# Patient Record
Sex: Female | Born: 1995
Health system: Southern US, Community
[De-identification: ages and names within clinical notes are randomized; demographics above are authoritative.]

## PROBLEM LIST (undated history)

## (undated) DIAGNOSIS — R011 Cardiac murmur, unspecified: Secondary | ICD-10-CM

## (undated) DIAGNOSIS — U071 COVID-19: Secondary | ICD-10-CM

## (undated) HISTORY — DX: COVID-19: U07.1

---

## 2019-05-24 ENCOUNTER — Emergency Department (HOSPITAL_COMMUNITY): Payer: Self-pay

## 2019-05-24 ENCOUNTER — Other Ambulatory Visit: Payer: Self-pay

## 2019-05-24 ENCOUNTER — Emergency Department (HOSPITAL_COMMUNITY)
Admission: EM | Admit: 2019-05-24 | Discharge: 2019-05-24 | Disposition: A | Discharge status: Home / Self Care | Payer: Self-pay | Attending: Emergency Medicine | Admitting: Emergency Medicine

## 2019-05-24 ENCOUNTER — Encounter (HOSPITAL_COMMUNITY): Payer: Self-pay | Admitting: Emergency Medicine

## 2019-05-24 DIAGNOSIS — N939 Abnormal uterine and vaginal bleeding, unspecified: Secondary | ICD-10-CM | POA: Insufficient documentation

## 2019-05-24 DIAGNOSIS — R0789 Other chest pain: Secondary | ICD-10-CM | POA: Insufficient documentation

## 2019-05-24 DIAGNOSIS — R531 Weakness: Secondary | ICD-10-CM | POA: Insufficient documentation

## 2019-05-24 HISTORY — DX: Cardiac murmur, unspecified: R01.1

## 2019-05-24 LAB — BASIC METABOLIC PANEL
Anion gap: 8 (ref 5–15)
BUN: 10 mg/dL (ref 6–20)
CO2: 24 mmol/L (ref 22–32)
Calcium: 9.2 mg/dL (ref 8.9–10.3)
Chloride: 106 mmol/L (ref 98–111)
Creatinine, Ser: 0.66 mg/dL (ref 0.44–1.00)
GFR calc Af Amer: >60 mL/min (ref >60)
GFR calc non Af Amer: >60 mL/min (ref >60)
Glucose, Bld: 114 mg/dL — ABNORMAL HIGH (ref 70–99)
Potassium: 4 mmol/L (ref 3.5–5.1)
Sodium: 138 mmol/L (ref 135–145)

## 2019-05-24 LAB — CBC
HCT: 42.2 % (ref 36.0–46.0)
Hemoglobin: 14 g/dL (ref 12.0–15.0)
MCH: 28.9 pg (ref 26.0–34.0)
MCHC: 33.2 g/dL (ref 30.0–36.0)
MCV: 87 fL (ref 80.0–100.0)
Platelets: 395 K/uL (ref 150–400)
RBC: 4.85 MIL/uL (ref 3.87–5.11)
RDW: 12.8 % (ref 11.5–15.5)
WBC: 13.6 K/uL — ABNORMAL HIGH (ref 4.0–10.5)
nRBC: 0 % (ref 0.0–0.2)

## 2019-05-24 LAB — D-DIMER, QUANTITATIVE: D-Dimer, Quant: 0.34 ug/mL-FEU (ref 0.00–0.50)

## 2019-05-24 LAB — TROPONIN I (HIGH SENSITIVITY)
Troponin I (High Sensitivity): <2 ng/L (ref <18)
Troponin I (High Sensitivity): <2 ng/L (ref <18)

## 2019-05-24 MED ORDER — NAPROXEN 500 MG PO TABS: 500.0000 mg | ORAL_TABLET | Freq: Two times a day (BID) | ORAL | 0 refills | Status: DC | PRN

## 2019-05-24 NOTE — ED Provider Notes (Signed)
[facility] Provider Note   CSN: [id number] Arrival date & time: 05/24/19  0018     History Chief Complaint  Patient presents with  . Chest Pain    weakness    Christine Freeman is a 24 y.o. female.  Patient here with chest tightness and shortness of breath that onset when she went to sleep around 10:30 PM.  States she did not actually go to sleep but felt tightness in her chest when she went to lie down.  It feels like a pressure.  It is sometimes in the center of her chest, times on the right sometimes on the left.  It comes and goes lasting for about 5 to 10 seconds at a time.  Patient actually had this pain on and off for the past several days since she started her menstrual cycle but became more severe tonight.  Also has some associated shortness of breath and pain with breathing that is new tonight.  There is no cough or fever.  Denies any possibility of pregnancy.  Denies any alcohol or drug use.  No birth control use.  She is not had any chest pain currently.  Is somewhat worse with palpation.  She denies any abdominal pain, nausea, vomiting, pain with urination or blood in the urine.  She is also having some occasional tingling in her arms and legs but no weakness.  Chest pain does not radiate anywhere.  The history is provided by the patient and a significant other.  Chest Pain Associated symptoms: no abdominal pain, no dizziness, no fever, no headache, no nausea, no vomiting and no weakness        Past Medical History:  Diagnosis Date  . Heart murmur     There are no problems to display for this patient.   History reviewed. No pertinent surgical history.   OB History   No obstetric history on file.     History reviewed. No pertinent family history.  Social History   Tobacco Use  . Smoking status: Never Smoker  . Smokeless tobacco: Never Used  Substance Use Topics  . Alcohol use: Not Currently  . Drug use: Never    Home  Medications Prior to Admission medications   Not on File    Allergies    Other  Review of Systems   Review of Systems  Constitutional: Negative for activity change, appetite change and fever.  HENT: Negative for congestion and rhinorrhea.   Respiratory: Positive for chest tightness.   Cardiovascular: Positive for chest pain.  Gastrointestinal: Negative for abdominal pain, nausea and vomiting.  Genitourinary: Positive for vaginal bleeding. Negative for dysuria and hematuria.  Musculoskeletal: Positive for arthralgias and myalgias.  Skin: Negative for rash.  Neurological: Negative for dizziness, weakness and headaches.   all other systems are negative except as noted in the HPI and PMH.    Physical Exam Updated Vital Signs BP 123/71 (BP Location: Right Arm)   Pulse 100   Temp 98.5 F (36.9 C) (Oral)   Resp 18   Ht 4\' 11"  (1.499 m)   Wt 72.6 kg   LMP 05/23/2019   SpO2 98%   BMI 32.32 kg/m   Physical Exam Vitals and nursing note reviewed.  Constitutional:      General: She is not in acute distress.    Appearance: She is well-developed.     Comments: Mildly anxious  HENT:     Head: Normocephalic and atraumatic.     Mouth/Throat:  Pharynx: No oropharyngeal exudate.  Eyes:     Conjunctiva/sclera: Conjunctivae normal.     Pupils: Pupils are equal, round, and reactive to light.  Neck:     Comments: No meningismus. Cardiovascular:     Rate and Rhythm: Normal rate and regular rhythm.     Heart sounds: Normal heart sounds. No murmur.  Pulmonary:     Effort: Pulmonary effort is normal. No respiratory distress.     Breath sounds: Normal breath sounds.     Comments: Central chest tenderness, worse with palpation Chest:     Chest wall: Tenderness present.  Abdominal:     Palpations: Abdomen is soft.     Tenderness: There is no abdominal tenderness. There is no guarding or rebound.  Musculoskeletal:        General: No tenderness. Normal range of motion.     Cervical  back: Normal range of motion and neck supple.  Skin:    General: Skin is warm.  Neurological:     Mental Status: She is alert and oriented to person, place, and time.     Cranial Nerves: No cranial nerve deficit.     Motor: No abnormal muscle tone.     Coordination: Coordination normal.     Comments:  5/5 strength throughout. CN 2-12 intact.Equal grip strength.   Psychiatric:        Behavior: Behavior normal.     ED Results / Procedures / Treatments   Labs (all labs ordered are listed, but only abnormal results are displayed) Labs Reviewed  CBC - Abnormal; Notable for the following components:      Result Value   WBC 13.6 (*)    All other components within normal limits  BASIC METABOLIC PANEL - Abnormal; Notable for the following components:   Glucose, Bld 114 (*)    All other components within normal limits  D-DIMER, QUANTITATIVE (NOT AT [facility])  I-STAT BETA HCG BLOOD, ED ([facility], [facility], [facility] ONLY)  TROPONIN I (HIGH SENSITIVITY)  TROPONIN I (HIGH SENSITIVITY)    EKG EKG Interpretation  Date/Time:  Saturday May 24 2019 00:49:40 EDT Ventricular Rate:  88 PR Interval:  130 QRS Duration: 76 QT Interval:  356 QTC Calculation: 430 R Axis:   85 Text Interpretation: Sinus rhythm with marked sinus arrhythmia Nonspecific T wave abnormality Abnormal ECG No previous ECGs available Confirmed by Glynn Octave [phone]) on 05/24/2019 12:55:47 AM   Radiology DG Chest 2 View  Result Date: 05/24/2019 CLINICAL DATA:  Chest pain and weakness for 2 hours EXAM: CHEST - 2 VIEW COMPARISON:  None. FINDINGS: Frontal and lateral views of the chest demonstrate an unremarkable cardiac silhouette. No airspace disease, effusion, or pneumothorax. There are no acute bony abnormalities. IMPRESSION: 1. No acute intrathoracic process. Electronically Signed   By: Sharlet Salina M.D.   On: 05/24/2019 01:45    Procedures Procedures (including critical care time)  Medications Ordered in ED Medications - No  data to display  ED Course  I have reviewed the triage vital signs and the nursing notes.  Pertinent labs & imaging results that were available during my care of the patient were reviewed by me and considered in my medical decision making (see chart for details).    MDM Rules/Calculators/A&P                      Intermittent chest tightness and shortness of breath since about 10:30 PM.  EKG is sinus rhythm with nonspecific T wave abnormality, no comparison.  Vitals are stable.  No hypoxia.  Troponin negative x2.  D-dimer negative.  Chest x-ray negative.  Electrolytes otherwise reassuring.  Low suspicion for ACS or PE.  Pain atypical for ACS.  May have anxiety component as well.  Will treat with anti-inflammatories for suspected musculoskeletal chest pain.  Follow-up with your PCP.  Return to the ED if chest pain becomes exertional, associated shortness of breath, nausea, vomiting, diaphoresis, or other concerns. Final Clinical Impression(s) / ED Diagnoses Final diagnoses:  Atypical chest pain    Rx / DC Orders ED Discharge Orders    None       Haylynn Pha, Jeannett Senior, MD 05/24/19 0745

## 2019-05-24 NOTE — Discharge Instructions (Signed)
There is no evidence of heart attack or blood clot in the lung.  Take the anti-inflammatory as prescribed.  Follow-up with your doctor.  Return to the ED if chest pain gets exertional, associated shortness of breath, nausea, vomiting, diaphoresis, other concerns.

## 2019-05-24 NOTE — ED Triage Notes (Signed)
Patient states chest pain and weakness that started 2 hours ago. Patient states that she is hurting all over.

## 2019-07-15 ENCOUNTER — Emergency Department (HOSPITAL_COMMUNITY)
Admission: EM | Admit: 2019-07-15 | Discharge: 2019-07-15 | Disposition: A | Discharge status: Home / Self Care | Payer: Self-pay | Attending: Emergency Medicine | Admitting: Emergency Medicine

## 2019-07-15 ENCOUNTER — Emergency Department (HOSPITAL_COMMUNITY): Payer: Self-pay

## 2019-07-15 ENCOUNTER — Encounter (HOSPITAL_COMMUNITY): Payer: Self-pay | Admitting: Emergency Medicine

## 2019-07-15 ENCOUNTER — Other Ambulatory Visit: Payer: Self-pay

## 2019-07-15 DIAGNOSIS — R0789 Other chest pain: Secondary | ICD-10-CM | POA: Insufficient documentation

## 2019-07-15 LAB — BASIC METABOLIC PANEL
Anion gap: 8 (ref 5–15)
BUN: 9 mg/dL (ref 6–20)
CO2: 23 mmol/L (ref 22–32)
Calcium: 9.1 mg/dL (ref 8.9–10.3)
Chloride: 106 mmol/L (ref 98–111)
Creatinine, Ser: 0.6 mg/dL (ref 0.44–1.00)
GFR calc Af Amer: >60 mL/min (ref >60)
GFR calc non Af Amer: >60 mL/min (ref >60)
Glucose, Bld: 90 mg/dL (ref 70–99)
Potassium: 3.9 mmol/L (ref 3.5–5.1)
Sodium: 137 mmol/L (ref 135–145)

## 2019-07-15 LAB — CBC WITH DIFFERENTIAL/PLATELET
Abs Immature Granulocytes: 0.06 10*3/uL (ref 0.00–0.07)
Basophils Absolute: 0.1 10*3/uL (ref 0.0–0.1)
Basophils Relative: 0 %
Eosinophils Absolute: 0.4 10*3/uL (ref 0.0–0.5)
Eosinophils Relative: 3 %
HCT: 41 % (ref 36.0–46.0)
Hemoglobin: 13.7 g/dL (ref 12.0–15.0)
Immature Granulocytes: 0 %
Lymphocytes Relative: 30 %
Lymphs Abs: 4.3 10*3/uL — ABNORMAL HIGH (ref 0.7–4.0)
MCH: 28.3 pg (ref 26.0–34.0)
MCHC: 33.4 g/dL (ref 30.0–36.0)
MCV: 84.7 fL (ref 80.0–100.0)
Monocytes Absolute: 0.6 10*3/uL (ref 0.1–1.0)
Monocytes Relative: 4 %
Neutro Abs: 9.2 10*3/uL — ABNORMAL HIGH (ref 1.7–7.7)
Neutrophils Relative %: 63 %
Platelets: 399 10*3/uL (ref 150–400)
RBC: 4.84 MIL/uL (ref 3.87–5.11)
RDW: 12.8 % (ref 11.5–15.5)
WBC: 14.7 10*3/uL — ABNORMAL HIGH (ref 4.0–10.5)
nRBC: 0 % (ref 0.0–0.2)

## 2019-07-15 LAB — URINALYSIS, ROUTINE W REFLEX MICROSCOPIC
Bilirubin Urine: NEGATIVE
Glucose, UA: NEGATIVE mg/dL
Hgb urine dipstick: NEGATIVE
Ketones, ur: NEGATIVE mg/dL
Leukocytes,Ua: NEGATIVE
Nitrite: NEGATIVE
Protein, ur: NEGATIVE mg/dL
Specific Gravity, Urine: 1.002 — ABNORMAL LOW (ref 1.005–1.030)
pH: 7 (ref 5.0–8.0)

## 2019-07-15 LAB — PREGNANCY, URINE: Preg Test, Ur: NEGATIVE

## 2019-07-15 LAB — TROPONIN I (HIGH SENSITIVITY)
Troponin I (High Sensitivity): <2 ng/L (ref <18)
Troponin I (High Sensitivity): <2 ng/L (ref <18)

## 2019-07-15 MED ORDER — DOXYCYCLINE HYCLATE 100 MG PO CAPS: 100.0000 mg | ORAL_CAPSULE | Freq: Two times a day (BID) | ORAL | 0 refills | Status: DC

## 2019-07-15 NOTE — Discharge Instructions (Addendum)
Take the antibiotic as directed until its finished.  You may continue taking over-the-counter naproxen (Aleve) 2 tablets will total 440 mg.  You may take 2 twice a day with food for pain or you may take ibuprofen 600 mg 3 times a day with food, but do not take both.  Be sure to keep your appointment with your primary care provider this week.  Return to the ER for any worsening symptoms.

## 2019-07-15 NOTE — ED Provider Notes (Signed)
[name] EMERGENCY DEPARTMENT Provider Note   CSN: [id number] Arrival date & time: 07/15/19  0935     History Chief Complaint  Patient presents with  . Chest Pain    Christine Freeman is a 24 y.o. female.  HPI      Christine Freeman is a 24 y.o. female who presents to the Emergency Department complaining of intermittent chest pain for two months.  She describes an aching pain to the center of her chest that is non-radiating.  Her pain is usually associated with her menstrual cycle, but she is now having pain and not menstruating.  She has been evaluated for her symptoms previously, but symptoms continue.  She denies fever, cough, shortness of breath.   Past Medical History:  Diagnosis Date  . Heart murmur     There are no problems to display for this patient.   History reviewed. No pertinent surgical history.   OB History   No obstetric history on file.     No family history on file.  Social History   Tobacco Use  . Smoking status: Never Smoker  . Smokeless tobacco: Never Used  Substance Use Topics  . Alcohol use: Yes    Comment: socially   . Drug use: Never    Home Medications Prior to Admission medications   Medication Sig Start Date End Date Taking? Authorizing Provider  naproxen (NAPROSYN) 500 MG tablet Take 1 tablet (500 mg total) by mouth 2 (two) times daily as needed. 05/24/19   Glynn Octave, MD    Allergies    Other  Review of Systems   Review of Systems  Constitutional: Negative for chills and fever.  Respiratory: Negative for cough and shortness of breath.   Cardiovascular: Positive for chest pain.  Gastrointestinal: Negative for abdominal pain, nausea and vomiting.  Genitourinary: Negative for dysuria and flank pain.  Musculoskeletal: Negative for back pain and neck pain.  Skin: Negative for color change and rash.  Neurological: Negative for dizziness, weakness, numbness and headaches.    Physical Exam Updated Vital Signs BP 107/65    Pulse 86   Temp 98.3 F (36.8 C) (Oral)   Resp 15   Ht 4\' 11"  (1.499 m)   Wt 72.6 kg   LMP 06/30/2019   SpO2 100%   BMI 32.32 kg/m   Physical Exam Vitals and nursing note reviewed.  Constitutional:      General: She is not in acute distress.    Appearance: Normal appearance. She is well-developed. She is not ill-appearing or toxic-appearing.  HENT:     Mouth/Throat:     Mouth: Mucous membranes are moist.  Cardiovascular:     Rate and Rhythm: Normal rate and regular rhythm.     Pulses: Normal pulses.  Pulmonary:     Effort: Pulmonary effort is normal. No respiratory distress.     Breath sounds: Normal breath sounds. No wheezing.  Chest:     Chest wall: No tenderness.  Abdominal:     Palpations: Abdomen is soft.     Tenderness: There is no abdominal tenderness.  Musculoskeletal:        General: Normal range of motion.     Cervical back: Normal range of motion.  Skin:    General: Skin is warm.     Capillary Refill: Capillary refill takes less than 2 seconds.     Findings: No rash.  Neurological:     General: No focal deficit present.     Mental Status: She  is alert.     ED Results / Procedures / Treatments   Labs (all labs ordered are listed, but only abnormal results are displayed) Labs Reviewed  CBC WITH DIFFERENTIAL/PLATELET - Abnormal; Notable for the following components:      Result Value   WBC 14.7 (*)    Neutro Abs 9.2 (*)    Lymphs Abs 4.3 (*)    All other components within normal limits  URINALYSIS, ROUTINE W REFLEX MICROSCOPIC - Abnormal; Notable for the following components:   Color, Urine COLORLESS (*)    Specific Gravity, Urine 1.002 (*)    All other components within normal limits  BASIC METABOLIC PANEL  PREGNANCY, URINE  TROPONIN I (HIGH SENSITIVITY)  TROPONIN I (HIGH SENSITIVITY)    EKG EKG Interpretation  Date/Time:  Tuesday July 15 2019 11:15:26 EDT Ventricular Rate:  90 PR Interval:    QRS Duration: 81 QT Interval:  344 QTC  Calculation: 421 R Axis:   83 Text Interpretation: Sinus rhythm Low voltage, precordial leads No significant change since last tracing Confirmed by Dorie Rank [phone]) on 07/15/2019 12:06:24 PM   Radiology DG Chest 1 View  Result Date: 07/15/2019 CLINICAL DATA:  Left-sided chest pain EXAM: CHEST  1 VIEW COMPARISON:  05/24/2019 FINDINGS: The heart size and mediastinal contours are within normal limits. Mildly prominent bibasilar interstitial markings. No pleural effusion or pneumothorax. The visualized skeletal structures are unremarkable. IMPRESSION: Mildly prominent bibasilar interstitial markings which may reflect atelectasis versus developing infiltrates in the appropriate clinical setting. Electronically Signed   By: Davina Poke D.O.   On: 07/15/2019 13:57    Procedures Procedures (including critical care time)  Medications Ordered in ED Medications - No data to display  ED Course  I have reviewed the triage vital signs and the nursing notes.  Pertinent labs & imaging results that were available during my care of the patient were reviewed by me and considered in my medical decision making (see chart for details).    MDM Rules/Calculators/A&P                      Pt with intermittent chest pain for 2 months.  CXR shows possible developing infiltrates. Mildly leukocytosis.  Afebrile.  PERC negative. Doubt ACS.   Will treat with abx.  Pt appears appropriate for d/c home.  Agrees to close out patient f/u.  Return precautions discussed.     Final Clinical Impression(s) / ED Diagnoses Final diagnoses:  Atypical chest pain    Rx / DC Orders ED Discharge Orders    None       Bufford Lope 07/18/19 1121    Dorie Rank, MD 07/19/19 0700

## 2019-07-15 NOTE — ED Triage Notes (Signed)
Pt c/o intermittent chest pain since April. States she has been evaluated before and was told her chest wall was irritated. Pt states the chest pain usually comes around her menstrual cycle, but this time she is not on her period. Pt tearful.

## 2019-07-18 ENCOUNTER — Other Ambulatory Visit: Payer: Self-pay

## 2019-07-18 ENCOUNTER — Ambulatory Visit (INDEPENDENT_AMBULATORY_CARE_PROVIDER_SITE_OTHER): Payer: Self-pay | Admitting: Family Medicine

## 2019-07-18 ENCOUNTER — Encounter: Payer: Self-pay | Admitting: Family Medicine

## 2019-07-18 VITALS — BP 106/68 | HR 86 | Temp 97.8°F | Ht 59.0 in | Wt 167.0 lb

## 2019-07-18 DIAGNOSIS — Z8679 Personal history of other diseases of the circulatory system: Secondary | ICD-10-CM

## 2019-07-18 DIAGNOSIS — Z09 Encounter for follow-up examination after completed treatment for conditions other than malignant neoplasm: Secondary | ICD-10-CM

## 2019-07-18 DIAGNOSIS — Z836 Family history of other diseases of the respiratory system: Secondary | ICD-10-CM

## 2019-07-18 DIAGNOSIS — E6609 Other obesity due to excess calories: Secondary | ICD-10-CM

## 2019-07-18 DIAGNOSIS — Z6833 Body mass index (BMI) 33.0-33.9, adult: Secondary | ICD-10-CM | POA: Insufficient documentation

## 2019-07-18 DIAGNOSIS — Z833 Family history of diabetes mellitus: Secondary | ICD-10-CM

## 2019-07-18 DIAGNOSIS — R002 Palpitations: Secondary | ICD-10-CM

## 2019-07-18 DIAGNOSIS — R0789 Other chest pain: Secondary | ICD-10-CM | POA: Insufficient documentation

## 2019-07-18 HISTORY — DX: Personal history of other diseases of the circulatory system: Z86.79

## 2019-07-18 HISTORY — DX: Family history of diabetes mellitus: Z83.3

## 2019-07-18 HISTORY — DX: Family history of other diseases of the respiratory system: Z83.6

## 2019-07-18 HISTORY — DX: Palpitations: R00.2

## 2019-07-18 NOTE — Patient Instructions (Signed)
    I appreciate the opportunity to provide you with care for your health and wellness. Today we discussed: established care  Follow up: 6 months   No labs or referrals today  Great to meet you both today! Have a good Summer; call if you need anything.  Please continue to practice social distancing to keep you, your family, and our community safe.  If you must go out, please wear a mask and practice good handwashing.  It was a pleasure to see you and I look forward to continuing to work together on your health and well-being. Please do not hesitate to call the office if you need care or have questions about your care.  Have a wonderful day and week. With Gratitude, Tereasa Coop, DNP, AGNP-BC    Palpitations Palpitations are feelings that your heartbeat is not normal. Your heartbeat may feel like it is:  Uneven.  Faster than normal.  Fluttering.  Skipping a beat. This is usually not a serious problem. In some cases, you may need tests to rule out any serious problems. Follow these instructions at home: Pay attention to any changes in your condition. Take these actions to help manage your symptoms: Eating and drinking  Avoid: ? Coffee, tea, soft drinks, and energy drinks. ? Chocolate. ? Alcohol. ? Diet pills. Lifestyle   Try to lower your stress. These things can help you relax: ? Yoga. ? Deep breathing and meditation. ? Exercise. ? Using words and images to create positive thoughts (guided imagery). ? Using your mind to control things in your body (biofeedback).  Do not use drugs.  Get plenty of rest and sleep. Keep a regular bed time. General instructions   Take over-the-counter and prescription medicines only as told by your doctor.  Do not use any products that contain nicotine or tobacco, such as cigarettes and e-cigarettes. If you need help quitting, ask your doctor.  Keep all follow-up visits as told by your doctor. This is important. You may need  more tests if palpitations do not go away or get worse. Contact a doctor if:  Your symptoms last more than 24 hours.  Your symptoms occur more often. Get help right away if you:  Have chest pain.  Feel short of breath.  Have a very bad headache.  Feel dizzy.  Pass out (faint). Summary  Palpitations are feelings that your heartbeat is uneven or faster than normal. It may feel like your heart is fluttering or skipping a beat.  Avoid food and drinks that may cause palpitations. These include caffeine, chocolate, and alcohol.  Try to lower your stress. Do not smoke or use drugs.  Get help right away if you faint or have chest pain, shortness of breath, a severe headache, or dizziness. This information is not intended to replace advice given to you by your health care provider. Make sure you discuss any questions you have with your health care provider. Document Revised: 03/07/2017 Document Reviewed: 03/07/2017 Elsevier Patient Education  2020 [employer].

## 2019-07-18 NOTE — Progress Notes (Signed)
Subjective:  Patient ID: Christine Freeman, female    DOB: 1995-10-08  Age: 24 y.o. MRN: [id number]  CC:  Chief Complaint  Patient presents with  . New Patient (Initial Visit)    new pt no former pcp has been seen twice in er at [facility] since april for chest pain heart palpatations and irregular heart beat has been better since the last visit at the er       HPI  HPI   Christine Freeman is a 24 year old female patient who presents today to establish care.  She presents with her boyfriend who is in the room.  And she is willing to have him in the emergency room discussion today.  Most recently was in the emergency room complaining of intermittent chest pain for the last 2 months.  She was described as an achy pain in the center of her chest that was nonradiating.  Pain was associated usually with her menstrual cycle but now she is having pain she was not menstruating with prompted her to go to the emergency room back on June 8.  She had been evaluated previously for similar symptoms but the symptoms continued.  She denied having any fevers, chills, shortness of breath or cough at that time.  Intermittent chest pain for 2 months.  Chest x-ray showed possible infiltrates mildly leukocytosis.  She was afebrile.  PERC negative.  Doubt it was ACS.  Treated with antibiotics.  Was appropriate to discharge home reports that she is taking her antibiotics as directed.  Does note that she had some improvement with some of her discomfort.  She reports that she has a family history of lung disease in several family members.  None of them smoked.  Family history of diabetics.  And she herself has had a heart murmur. She has a family tree for her GYN care Pap smears.  She has good cycles that are regular cramps have gotten worse but overall she is doing okay with them.  She does not currently have insurance at this time so she is unable to see a dentist.  She does wear glasses overall glasses are doing well.   Has not seen the eye doctor little bit.  Palpitations are not associated with psych or caffeine.  Use about 3 seconds will take her breath away or make her go to the floor very frightening for her.  She reports that she has a cousin who stated that he had A. Fib.  She has gone on hikes and done physical activity without any problem or palpitation irregularity.  She is concerned about exercising and wanted to know what she can do what she can eat because she is afraid that whenever she does her eat could trigger the heart palpitations and make her feel bad.  Today patient denies signs and symptoms of COVID 19 infection including fever, chills, cough, shortness of breath, and headache. Past Medical, Surgical, Social History, Allergies, and Medications have been Reviewed.   Past Medical History:  Diagnosis Date  . Heart murmur     Current Meds  Medication Sig  . acetaminophen (TYLENOL) 325 MG tablet Take 650 mg by mouth every 6 (six) hours as needed.  . doxycycline (VIBRAMYCIN) 100 MG capsule Take 1 capsule (100 mg total) by mouth 2 (two) times daily.  Marland Kitchen ibuprofen (ADVIL) 200 MG tablet Take 200 mg by mouth every 6 (six) hours as needed.  . naproxen (NAPROSYN) 500 MG tablet Take 1 tablet (500  mg total) by mouth 2 (two) times daily as needed.    ROS:  Review of Systems  Constitutional: Negative.   HENT: Negative.   Eyes: Negative.   Respiratory: Negative.   Cardiovascular: Positive for chest pain and palpitations.  Gastrointestinal: Negative.   Genitourinary: Negative.   Musculoskeletal: Negative.   Skin: Negative.   Neurological: Negative.   Endo/Heme/Allergies: Negative.   Psychiatric/Behavioral: The patient is nervous/anxious.   All other systems reviewed and are negative.    Objective:   Today's Vitals: BP 106/68 (BP Location: Right Arm, Patient Position: Sitting, Cuff Size: Normal)   Pulse 86   Temp 97.8 F (36.6 C) (Temporal)   Ht 4\' 11"  (1.499 m)   Wt 167 lb (75.8  kg)   LMP 06/30/2019   SpO2 99%   BMI 33.73 kg/m  Vitals with BMI 07/18/2019 07/15/2019 07/15/2019  Height 4\' 11"  - -  Weight 167 lbs - -  BMI 33.71 - -  Systolic 106 112 -  Diastolic 68 76 -  Pulse 86 - 91     Physical Exam Vitals and nursing note reviewed.  Constitutional:      Appearance: Normal appearance. She is well-developed and well-groomed. She is obese.  HENT:     Head: Normocephalic and atraumatic.     Right Ear: External ear normal.     Left Ear: External ear normal.     Mouth/Throat:     Comments: Mask in place  Eyes:     General:        Right eye: No discharge.        Left eye: No discharge.     Conjunctiva/sclera: Conjunctivae normal.  Cardiovascular:     Rate and Rhythm: Normal rate and regular rhythm.     Pulses: Normal pulses.     Heart sounds: Normal heart sounds.  Pulmonary:     Effort: Pulmonary effort is normal.     Breath sounds: Normal breath sounds.  Musculoskeletal:        General: Normal range of motion.     Cervical back: Normal range of motion and neck supple.  Skin:    General: Skin is warm.  Neurological:     General: No focal deficit present.     Mental Status: She is alert and oriented to person, place, and time.  Psychiatric:        Attention and Perception: Attention normal.        Mood and Affect: Mood normal.        Speech: Speech normal.        Behavior: Behavior normal. Behavior is cooperative.        Thought Content: Thought content normal.        Cognition and Memory: Cognition normal.        Judgment: Judgment normal.    Depression screen PHQ 2/9 07/18/2019  Decreased Interest 0  Down, Depressed, Hopeless 0  PHQ - 2 Score 0  Altered sleeping 0  Tired, decreased energy 2  Change in appetite 0  Feeling bad or failure about yourself  0  Trouble concentrating 0  Moving slowly or fidgety/restless 0  Suicidal thoughts 0  PHQ-9 Score 2  Difficult doing work/chores Not difficult at all      Assessment   1.  Palpitations with regular cardiac rhythm   2. Atypical chest pain   3. Class 1 obesity due to excess calories without serious comorbidity with body mass index (BMI) of 33.0 to 33.9 in adult  4. History of heart murmur in childhood   5. Family history of lung disease   6. Family history of diabetes mellitus     Tests ordered No orders of the defined types were placed in this encounter.    Plan: Please see assessment and plan per problem list above.   No orders of the defined types were placed in this encounter.   Patient to follow-up in 6 months   Perlie Mayo, NP

## 2019-07-24 DIAGNOSIS — Z09 Encounter for follow-up examination after completed treatment for conditions other than malignant neoplasm: Secondary | ICD-10-CM | POA: Insufficient documentation

## 2019-07-24 NOTE — Assessment & Plan Note (Signed)
  educated about the importance of exercise daily to help with weight management. A minumum of 30 minutes daily is recommended. Additionally, importance of healthy food choices  with portion control discussed. Wt Readings from Last 3 Encounters:  07/18/19 167 lb (75.8 kg)  07/15/19 160 lb (72.6 kg)  05/24/19 160 lb (72.6 kg)

## 2019-07-24 NOTE — Assessment & Plan Note (Signed)
Not having any chest pain today in office.  Overall she is had good EKGs.  Unsure of true cardiac etiology.  She had palpitations was seen to have a change on her x-ray at her last visit to the emergency room and was started on medication.  She reports taking her medication as directed.  Is already starting to feel better.

## 2019-07-24 NOTE — Assessment & Plan Note (Signed)
Reviewed most hospital notes over last 2 months.  Extensively with labs, findings and testing.  I agree with everything that has been documented and diagnosed.  Encouraged her to continue her antibiotic at this time.  If she reports that she continues to have palpitations we will get a cardiology referral for possible Holter monitor assessment. Patient acknowledged agreement and understanding of the plan.

## 2019-07-24 NOTE — Assessment & Plan Note (Signed)
I do not see any or hear any murmur on exam today.

## 2019-07-24 NOTE — Assessment & Plan Note (Signed)
Reports palpitations unable to catch these on EKGs when she was in the emergency room.  If she continues to have the issue or concern we will look at doing a cardiology referral.  I have educated her on the signs and symptoms and causes of the palpitations and that she should exercise and not refrain from doing so.  She should also refrain from doing extra caffeine as she was drinking a lot of caffeine when these first started.

## 2019-07-28 ENCOUNTER — Telehealth: Payer: Self-pay

## 2019-07-28 NOTE — Telephone Encounter (Signed)
Spoke with patient. She states she would like a refill on the doxycycline she was taking. Her palps have come back since she finished it. She saw Dahlia Client on 6/11 for this issue. I advised her we usually dont refill an antibiotic without a visit. Appt scheduled for tomorrow at 3pm with [name]

## 2019-07-28 NOTE — Telephone Encounter (Signed)
Pt is calling advising that she has random heart palpations\Chest Pains.  Wants to see if the Dr can prescribe her something.   Sending back to Abby---

## 2019-07-29 ENCOUNTER — Other Ambulatory Visit: Payer: Self-pay

## 2019-07-29 ENCOUNTER — Ambulatory Visit (INDEPENDENT_AMBULATORY_CARE_PROVIDER_SITE_OTHER): Payer: Self-pay | Admitting: Family Medicine

## 2019-07-29 ENCOUNTER — Encounter: Payer: Self-pay | Admitting: Family Medicine

## 2019-07-29 VITALS — BP 111/74 | HR 86 | Temp 97.5°F | Ht 59.0 in | Wt 165.0 lb

## 2019-07-29 DIAGNOSIS — R002 Palpitations: Secondary | ICD-10-CM

## 2019-07-29 DIAGNOSIS — R0789 Other chest pain: Secondary | ICD-10-CM

## 2019-07-29 NOTE — Progress Notes (Signed)
Subjective:  Patient ID: Christine Freeman, female    DOB: 10/29/1995  Age: 24 y.o. MRN: [id number]  CC:  Chief Complaint  Patient presents with  . Chest Pain    no palpatations started saturday the first day she didnt have naproxen and doxicycline since she ran out she has had the chest pain has had it every day since then very random comes and goes       HPI  HPI  Christine Freeman is a 24 year old female who presents today after reporting that she thinks that she still has ongoing chest discomfort that returned after finishing her naproxen and doxycycline.  She reports that she has had the discomfort since stopping the medications however she has not had any discomfort today.  She reports that she has been taking 600 mg of ibuprofen as needed for chest discomfort.  She has not focused on any exercises or stretches or posture changes but has been trying to make sure she drinks decaf and working on what she eats to avoid heartburn-like symptoms. She denies having any chest pain today in the office.  Denies having any palpitations today in the office.  Today patient denies signs and symptoms of COVID 19 infection including fever, chills, cough, shortness of breath, and headache. Past Medical, Surgical, Social History, Allergies, and Medications have been Reviewed.   Past Medical History:  Diagnosis Date  . Heart murmur     Current Meds  Medication Sig  . ibuprofen (ADVIL) 200 MG tablet Take 200 mg by mouth every 6 (six) hours as needed.  . [DISCONTINUED] acetaminophen (TYLENOL) 325 MG tablet Take 650 mg by mouth every 6 (six) hours as needed.  . [DISCONTINUED] doxycycline (VIBRAMYCIN) 100 MG capsule Take 1 capsule (100 mg total) by mouth 2 (two) times daily.  . [DISCONTINUED] naproxen (NAPROSYN) 500 MG tablet Take 1 tablet (500 mg total) by mouth 2 (two) times daily as needed.    ROS:  Review of Systems  Constitutional: Negative.   HENT: Negative.   Eyes: Negative.   Respiratory:  Negative.   Cardiovascular: Positive for palpitations.  Gastrointestinal: Negative.   Genitourinary: Negative.   Musculoskeletal: Negative.   Skin: Negative.   Neurological: Negative.   Endo/Heme/Allergies: Negative.   Psychiatric/Behavioral: Negative.   All other systems reviewed and are negative.    Objective:   Today's Vitals: BP 111/74 (BP Location: Left Arm, Patient Position: Sitting, Cuff Size: Normal)   Pulse 86   Temp (!) 97.5 F (36.4 C) (Temporal)   Ht 4\' 11"  (1.499 m)   Wt 165 lb (74.8 kg)   LMP 06/30/2019   SpO2 98%   BMI 33.33 kg/m  Vitals with BMI 07/29/2019 07/18/2019 07/15/2019  Height 4\' 11"  4\' 11"  -  Weight 165 lbs 167 lbs -  BMI 33.31 33.71 -  Systolic 111 106 09/14/2019  Diastolic 74 68 76  Pulse 86 86 -     Physical Exam Vitals and nursing note reviewed.  Constitutional:      Appearance: Normal appearance. She is well-developed and well-groomed. She is obese.  HENT:     Head: Normocephalic and atraumatic.     Right Ear: External ear normal.     Left Ear: External ear normal.     Nose: Nose normal.     Mouth/Throat:     Mouth: Mucous membranes are moist.     Pharynx: Oropharynx is clear.  Eyes:     General:  Right eye: No discharge.        Left eye: No discharge.     Conjunctiva/sclera: Conjunctivae normal.  Cardiovascular:     Rate and Rhythm: Normal rate and regular rhythm.     Pulses: Normal pulses.     Heart sounds: Normal heart sounds.  Pulmonary:     Effort: Pulmonary effort is normal.     Breath sounds: Normal breath sounds.  Musculoskeletal:        General: Normal range of motion.     Cervical back: Normal range of motion and neck supple.  Skin:    General: Skin is warm.  Neurological:     General: No focal deficit present.     Mental Status: She is alert and oriented to person, place, and time.  Psychiatric:        Attention and Perception: Attention normal.        Mood and Affect: Mood normal.        Speech: Speech normal.         Behavior: Behavior normal. Behavior is cooperative.        Thought Content: Thought content normal.        Cognition and Memory: Cognition normal.        Judgment: Judgment normal.      Assessment   1. Palpitations with regular cardiac rhythm   2. Atypical chest pain     Tests ordered No orders of the defined types were placed in this encounter.    Plan: Please see assessment and plan per problem list above.   No orders of the defined types were placed in this encounter.   Patient to follow-up in as scheduled  Perlie Mayo, NP

## 2019-07-29 NOTE — Assessment & Plan Note (Signed)
PE good, encouraged exercise and posturing strengthening.

## 2019-07-29 NOTE — Patient Instructions (Addendum)
I appreciate the opportunity to provide you with care for your health and wellness. Today we discussed:  Palpitations  Follow up: 01/16/2020 (as scheduled) or as needed   No labs or referrals today  Please look into the yoga stretches for the upper back, neck, and shoulders  Use tylenol or ibuprofen as needed.  Please continue to practice social distancing to keep you, your family, and our community safe.  If you must go out, please wear a mask and practice good handwashing.  It was a pleasure to see you and I look forward to continuing to work together on your health and well-being. Please do not hesitate to call the office if you need care or have questions about your care.  Have a wonderful day and week. With Gratitude, Tereasa Coop, DNP, AGNP-BC

## 2019-07-29 NOTE — Assessment & Plan Note (Signed)
Reports increase in palpitations but might be due to pain, lack of sleep. She is not having any today. PE is unremarkable. Encouraged to focus on the previous discussion with eating, sleeping, and exercising.

## 2019-08-13 ENCOUNTER — Encounter: Payer: Self-pay | Admitting: Family Medicine

## 2019-08-14 ENCOUNTER — Ambulatory Visit
Admission: EM | Admit: 2019-08-14 | Discharge: 2019-08-14 | Disposition: A | Discharge status: Home / Self Care | Payer: Self-pay

## 2019-08-14 ENCOUNTER — Emergency Department (HOSPITAL_COMMUNITY): Payer: Self-pay

## 2019-08-14 ENCOUNTER — Other Ambulatory Visit: Payer: Self-pay

## 2019-08-14 ENCOUNTER — Encounter (HOSPITAL_COMMUNITY): Payer: Self-pay | Admitting: Emergency Medicine

## 2019-08-14 ENCOUNTER — Telehealth: Payer: Self-pay | Admitting: Family Medicine

## 2019-08-14 ENCOUNTER — Emergency Department (HOSPITAL_COMMUNITY)
Admission: EM | Admit: 2019-08-14 | Discharge: 2019-08-14 | Disposition: A | Discharge status: Home / Self Care | Payer: Self-pay | Attending: Emergency Medicine | Admitting: Emergency Medicine

## 2019-08-14 DIAGNOSIS — R0789 Other chest pain: Secondary | ICD-10-CM | POA: Insufficient documentation

## 2019-08-14 MED ORDER — MELOXICAM 7.5 MG PO TABS: 7.5000 mg | ORAL_TABLET | Freq: Two times a day (BID) | ORAL | 0 refills | Status: AC | PRN

## 2019-08-14 NOTE — Telephone Encounter (Signed)
Pt was advised to go to the ED per the nurse due to chest pain

## 2019-08-14 NOTE — ED Notes (Signed)
Patient is being discharged from the Urgent Care and sent to the Emergency Department via pov . Per Doyce Para, patient is in need of higher level of care due to cp and shortness of breath. Patient is aware and verbalizes understanding of plan of care. There were no vitals filed for this visit.

## 2019-08-14 NOTE — ED Provider Notes (Signed)
[facility] EMERGENCY DEPARTMENT Provider Note   CSN: [id number] Arrival date & time: 08/14/19  9753     History Chief Complaint  Patient presents with  . Shortness of Breath    Christine Freeman is a 24 y.o. female.  HPI   24 y/o female =- no sig PMH Has had some shallow breathing starting last night Some CP with this - feels like she can't get a full breath, CP on the left side and under the armpit, not with breathing Tried nsaids and apap last night with minimal relief.   No fevers / cough or pain with breathing. No recent illness or sick contacts, no DVT hx,  Past Medical History:  Diagnosis Date  . Heart murmur     Patient Active Problem List   Diagnosis Date Noted  . Encounter for examination following treatment at hospital 07/24/2019  . Palpitations with regular cardiac rhythm 07/18/2019  . Atypical chest pain 07/18/2019  . Class 1 obesity due to excess calories without serious comorbidity with body mass index (BMI) of 33.0 to 33.9 in adult 07/18/2019  . History of heart murmur in childhood 07/18/2019  . Family history of lung disease 07/18/2019  . Family history of diabetes mellitus 07/18/2019    History reviewed. No pertinent surgical history.   OB History   No obstetric history on file.     Family History  Problem Relation Age of Onset  . Diabetes Maternal Aunt     Social History   Tobacco Use  . Smoking status: Never Smoker  . Smokeless tobacco: Never Used  Vaping Use  . Vaping Use: Never used  Substance Use Topics  . Alcohol use: Yes    Comment: socially   . Drug use: Never    Home Medications Prior to Admission medications   Medication Sig Start Date End Date Taking? Authorizing Provider  acetaminophen (TYLENOL) 325 MG tablet Take 650 mg by mouth as needed.   Yes [provider]  ibuprofen (ADVIL) 200 MG tablet Take 200 mg by mouth every 6 (six) hours as needed.   Yes [provider]  Prenatal Multivit-Min-Fe-FA  (PRE-NATAL FORMULA PO) Take 2 tablets by mouth daily.   Yes [provider]  meloxicam (MOBIC) 7.5 MG tablet Take 1 tablet (7.5 mg total) by mouth 2 (two) times daily as needed for up to 14 days for pain. 08/14/19 08/28/19  Eber Hong, MD    Allergies    Other  Review of Systems   Review of Systems  All other systems reviewed and are negative.   Physical Exam Updated Vital Signs BP 119/63 (BP Location: Left Arm)   Pulse 89   Temp 98.9 F (37.2 C) (Oral)   Resp 18   Ht 1.499 m (4\' 11" )   Wt 74.8 kg   LMP 07/21/2019   SpO2 100%   BMI 33.33 kg/m   Physical Exam Vitals and nursing note reviewed.  Constitutional:      General: She is not in acute distress.    Appearance: She is well-developed.  HENT:     Head: Normocephalic and atraumatic.     Mouth/Throat:     Pharynx: No oropharyngeal exudate.  Eyes:     General: No scleral icterus.       Right eye: No discharge.        Left eye: No discharge.     Conjunctiva/sclera: Conjunctivae normal.     Pupils: Pupils are equal, round, and reactive to light.  Neck:  Thyroid: No thyromegaly.     Vascular: No JVD.  Cardiovascular:     Rate and Rhythm: Normal rate and regular rhythm.     Heart sounds: Normal heart sounds. No murmur heard.  No friction rub. No gallop.   Pulmonary:     Effort: Pulmonary effort is normal. No respiratory distress.     Breath sounds: Normal breath sounds. No wheezing or rales.  Chest:     Chest wall: Tenderness present.  Abdominal:     General: Bowel sounds are normal. There is no distension.     Palpations: Abdomen is soft. There is no mass.     Tenderness: There is no abdominal tenderness.  Musculoskeletal:        General: No tenderness. Normal range of motion.     Cervical back: Normal range of motion and neck supple.  Lymphadenopathy:     Cervical: No cervical adenopathy.  Skin:    General: Skin is warm and dry.     Findings: No erythema or rash.  Neurological:     Mental  Status: She is alert.     Coordination: Coordination normal.  Psychiatric:        Behavior: Behavior normal.     ED Results / Procedures / Treatments   Labs (all labs ordered are listed, but only abnormal results are displayed) Labs Reviewed - No data to display  EKG EKG Interpretation  Date/Time:  Thursday August 14 2019 10:17:53 EDT Ventricular Rate:  86 PR Interval:    QRS Duration: 82 QT Interval:  352 QTC Calculation: 421 R Axis:   58 Text Interpretation: Sinus rhythm Baseline wander in lead(s) V6 Normal ECG Confirmed by Eber Hong ([id number]) on 08/14/2019 10:20:10 AM   Radiology DG Chest 2 View  Result Date: 08/14/2019 CLINICAL DATA:  Chest pain EXAM: CHEST - 2 VIEW COMPARISON:  July 15, 2019 FINDINGS: Lungs are clear. The heart size and pulmonary vascularity are normal. No adenopathy. No pneumothorax. No bone lesions. IMPRESSION: Lungs clear.  Cardiac silhouette normal. Electronically Signed   By: Bretta Bang III M.D.   On: 08/14/2019 11:05    Procedures Procedures (including critical care time)  Medications Ordered in ED Medications - No data to display  ED Course  I have reviewed the triage vital signs and the nursing notes.  Pertinent labs & imaging results that were available during my care of the patient were reviewed by me and considered in my medical decision making (see chart for details).    MDM Rules/Calculators/A&P                          The patient reports having a waxing and waning pain that is both left side and right side, does not particularly get worse with movements, breathing or any specific actions, she did have a recent pneumonia but is not coughing or short of breath at this time.  Vital signs are normal with an oxygen of 100%.  Breast exam done with the patient's permission and chaperone present shows diffuse chest wall tenderness both right left and parasternal, the breast exam was totally normal, there is no lymphadenopathy redness  streaking or discharge from the nipples.  We will obtain a chest x-ray and an EKG to rule out any other causes of the patient's pathology though she looks well and has normal vital signs, no tachycardia, she is PERC negative.  No travel, trauma, immobilization, surgery, hormone use, tobacco use or prior history  of venous thromboembolism.  I have personally viewed the chest x-ray, there is not appear to be any signs of infiltrate pneumothorax or abnormal mediastinum or cardiac silhouette, normal x-ray, I agree with the radiologist, patient stable for discharge  Final Clinical Impression(s) / ED Diagnoses Final diagnoses:  Chest wall pain    Rx / DC Orders ED Discharge Orders         Ordered    meloxicam (MOBIC) 7.5 MG tablet  2 times daily PRN     Discontinue  Reprint     08/14/19 1154           Eber Hong, MD 08/14/19 1155

## 2019-08-14 NOTE — ED Triage Notes (Signed)
Pt reports shallow breathing and cp to LT chest under breast for past few days. Pt ambulatory from waiting room w/ no difficulty breathing observed.

## 2019-08-14 NOTE — ED Triage Notes (Signed)
Pt c/o SOB and intermittent chest pain (various locations) x 1 mo, pt reports she was treated for pneumonia in June and symptoms have continued since then; reports she has been seen by PCP

## 2019-08-14 NOTE — Discharge Instructions (Signed)
Your testing in the emergency department was normal, your EKG shows that your heart is functioning properly and your chest x-ray appears normal without any signs of abnormal lungs heart or any other structures.  This is reassuring.  You may take Mobic twice a day as needed for pain, if this is getting worse come back to the hospital however you should follow-up with your doctor within the week for recheck if the symptoms are not getting any better.

## 2019-08-18 ENCOUNTER — Encounter: Payer: Self-pay | Admitting: Family Medicine

## 2019-08-19 NOTE — Telephone Encounter (Signed)
Pt was made an appt by [facility]

## 2019-08-21 ENCOUNTER — Other Ambulatory Visit: Payer: Self-pay

## 2019-08-21 ENCOUNTER — Ambulatory Visit (INDEPENDENT_AMBULATORY_CARE_PROVIDER_SITE_OTHER): Payer: Self-pay | Admitting: Family Medicine

## 2019-08-21 ENCOUNTER — Encounter: Payer: Self-pay | Admitting: Family Medicine

## 2019-08-21 VITALS — BP 124/76 | HR 90 | Temp 97.7°F | Resp 18 | Ht 59.0 in | Wt 167.4 lb

## 2019-08-21 DIAGNOSIS — Z6833 Body mass index (BMI) 33.0-33.9, adult: Secondary | ICD-10-CM

## 2019-08-21 DIAGNOSIS — Z09 Encounter for follow-up examination after completed treatment for conditions other than malignant neoplasm: Secondary | ICD-10-CM

## 2019-08-21 DIAGNOSIS — E6609 Other obesity due to excess calories: Secondary | ICD-10-CM

## 2019-08-21 DIAGNOSIS — R0789 Other chest pain: Secondary | ICD-10-CM

## 2019-08-21 DIAGNOSIS — Z833 Family history of diabetes mellitus: Secondary | ICD-10-CM

## 2019-08-21 DIAGNOSIS — R0602 Shortness of breath: Secondary | ICD-10-CM

## 2019-08-21 DIAGNOSIS — R002 Palpitations: Secondary | ICD-10-CM

## 2019-08-21 LAB — POCT GLYCOSYLATED HEMOGLOBIN (HGB A1C)
HbA1c POC (<> result, manual entry): 5.4 % (ref 4.0–5.6)
HbA1c, POC (controlled diabetic range): 5.4 % (ref 0.0–7.0)
HbA1c, POC (prediabetic range): 5.4 % — AB (ref 5.7–6.4)
Hemoglobin A1C: 5.4 % (ref 4.0–5.6)

## 2019-08-21 MED ORDER — ALBUTEROL SULFATE HFA 108 (90 BASE) MCG/ACT IN AERS: 1.0000 | INHALATION_SPRAY | Freq: Four times a day (QID) | RESPIRATORY_TRACT | 0 refills | Status: AC | PRN

## 2019-08-21 NOTE — Progress Notes (Signed)
Subjective:  Patient ID: Christine Freeman, female    DOB: 1996-01-20  Age: 24 y.o. MRN: [id number]  CC:  Chief Complaint  Patient presents with  . Follow-up      HPI  HPI  Christine Freeman is a 24 year old female patient of mine, newly established. She presents today to follow up from recent ER visit after experiencing repeat questionable chest wall pains. She has been feeling these off and on since May. She also reports an increase in palpitations, shortness of breath at times, weakness, and brain fog. She can not identify what causes these symptoms. Originally ches pains and palpitations were thought to be hormonal driven, noting an increase about her cycles.  She has been cleared with xrays, ekgs and labs recently. She was treated for infiltrates a little over a month back. She reported th pain in chest return when she completed her antibiotic. She is noted to have a personal history of COVID 19 back in Dec 2020. Prior to this she reports no trouble with overall health.  Today she denies chest pain, shortness of breath, weakness or any other recurrent symptoms she has been experiencing.   We has discussed reducing anxiety, self care with diet and exercise at previous visits.  She reports she is worried to exercise or do much of anything due to her symptoms.  Today patient denies signs and symptoms of COVID 19 infection including fever, chills, cough, shortness of breath, and headache. Past Medical, Surgical, Social History, Allergies, and Medications have been Reviewed.   Past Medical History:  Diagnosis Date  . Heart murmur     Current Meds  Medication Sig  . acetaminophen (TYLENOL) 325 MG tablet Take 650 mg by mouth as needed.  Marland Kitchen ibuprofen (ADVIL) 200 MG tablet Take 200 mg by mouth every 6 (six) hours as needed.  . meloxicam (MOBIC) 7.5 MG tablet Take 1 tablet (7.5 mg total) by mouth 2 (two) times daily as needed for up to 14 days for pain.  . Prenatal Multivit-Min-Fe-FA (PRE-NATAL  FORMULA PO) Take 2 tablets by mouth daily.    ROS:  Review of Systems  Constitutional: Negative.   HENT: Negative.   Eyes: Negative.   Respiratory: Negative.   Cardiovascular: Negative.   Gastrointestinal: Negative.   Genitourinary: Negative.   Musculoskeletal: Negative.   Skin: Negative.   Neurological: Negative.   Endo/Heme/Allergies: Negative.   Psychiatric/Behavioral: Negative.   All other systems reviewed and are negative.    Objective:   Today's Vitals: BP 124/76 (BP Location: Right Arm, Patient Position: Sitting, Cuff Size: Normal)   Pulse 90   Temp 97.7 F (36.5 C) (Temporal)   Resp 18   Ht 4' 11"  (1.499 m)   Wt 167 lb 6.4 oz (75.9 kg)   LMP 08/19/2019 (Exact Date)   SpO2 98%   BMI 33.81 kg/m  Vitals with BMI 08/21/2019 08/14/2019 08/14/2019  Height 4' 11"  - -  Weight 167 lbs 6 oz - -  BMI 82.50 - -  Systolic 037 99 048  Diastolic 76 63 63  Pulse 90 80 89     Physical Exam Vitals and nursing note reviewed.  Constitutional:      Appearance: Normal appearance. She is well-developed and well-groomed. She is obese.  HENT:     Head: Normocephalic and atraumatic.     Right Ear: External ear normal.     Left Ear: External ear normal.     Mouth/Throat:     Comments:  Mask in place  Eyes:     General:        Right eye: No discharge.        Left eye: No discharge.     Conjunctiva/sclera: Conjunctivae normal.  Cardiovascular:     Rate and Rhythm: Normal rate and regular rhythm.     Pulses: Normal pulses.     Heart sounds: Normal heart sounds.  Pulmonary:     Effort: Pulmonary effort is normal.     Breath sounds: Normal breath sounds.  Musculoskeletal:        General: Normal range of motion.     Cervical back: Normal range of motion and neck supple.  Skin:    General: Skin is warm.  Neurological:     General: No focal deficit present.     Mental Status: She is alert and oriented to person, place, and time.  Psychiatric:        Attention and Perception:  Attention normal.        Mood and Affect: Mood normal.        Speech: Speech normal.        Behavior: Behavior normal. Behavior is cooperative.        Thought Content: Thought content normal.        Cognition and Memory: Cognition normal.        Judgment: Judgment normal.    Assessment   1. Palpitations with regular cardiac rhythm   2. Atypical chest pain   3. Shortness of breath   4. Class 1 obesity due to excess calories without serious comorbidity with body mass index (BMI) of 33.0 to 33.9 in adult   5. Family history of diabetes mellitus   6. Encounter for examination following treatment at hospital     Tests ordered Orders Placed This Encounter  Procedures  . Magnesium  . Brain natriuretic peptide  . CBC  . COMPLETE METABOLIC PANEL WITH GFR  . TSH  . VITAMIN D 25 Hydroxy (Vit-D Deficiency, Fractures)  . CMP14+EGFR  . Ambulatory referral to Cardiology  . POCT glycosylated hemoglobin (Hb A1C)     Plan: Please see assessment and plan per problem list above.   Meds ordered this encounter  Medications  . albuterol (VENTOLIN HFA) 108 (90 Base) MCG/ACT inhaler    Sig: Inhale 1-2 puffs into the lungs every 6 (six) hours as needed for wheezing or shortness of breath.    Dispense:  8 g    Refill:  0    Order Specific Question:   Supervising Provider    Answer:   Fayrene Helper [[id number]]    Patient to follow-up in 6 weeks   Perlie Mayo, NP

## 2019-08-21 NOTE — Patient Instructions (Addendum)
I appreciate the opportunity to provide you with care for your health and wellness. Today we discussed: breast pain; palpitations  Follow up:  6-8 weeks   Labs today at [facility], A1c check in office Referrals today-Cardiology  Please use inhaler as needed as well as 30 mins prior to activity. Continue to focus on self care-yoga, walking, eat a well balanced diet. Being active is more of a benefit than not being active. Vitamin E 200 mg daily for breast pain Prenatal vitamin daily.  I hope we can sort this out soon.  Please continue to practice social distancing to keep you, your family, and our community safe.  If you must go out, please wear a mask and practice good handwashing.  It was a pleasure to see you and I look forward to continuing to work together on your health and well-being. Please do not hesitate to call the office if you need care or have questions about your care.  Have a wonderful day and week. With Gratitude, Tereasa Coop, DNP, AGNP-BC

## 2019-08-22 ENCOUNTER — Other Ambulatory Visit (HOSPITAL_COMMUNITY)
Admission: RE | Admit: 2019-08-22 | Discharge: 2019-08-22 | Disposition: A | Discharge status: Home / Self Care | Payer: Self-pay | Source: Ambulatory Visit | Attending: Family Medicine | Admitting: Family Medicine

## 2019-08-22 DIAGNOSIS — R002 Palpitations: Secondary | ICD-10-CM | POA: Insufficient documentation

## 2019-08-22 DIAGNOSIS — R0602 Shortness of breath: Secondary | ICD-10-CM | POA: Insufficient documentation

## 2019-08-22 DIAGNOSIS — Z833 Family history of diabetes mellitus: Secondary | ICD-10-CM | POA: Insufficient documentation

## 2019-08-22 LAB — COMPREHENSIVE METABOLIC PANEL
ALT: 10 U/L (ref 0–44)
AST: 15 U/L (ref 15–41)
Albumin: 4 g/dL (ref 3.5–5.0)
Alkaline Phosphatase: 50 U/L (ref 38–126)
Anion gap: 9 (ref 5–15)
BUN: 9 mg/dL (ref 6–20)
CO2: 25 mmol/L (ref 22–32)
Calcium: 9.1 mg/dL (ref 8.9–10.3)
Chloride: 102 mmol/L (ref 98–111)
Creatinine, Ser: 0.61 mg/dL (ref 0.44–1.00)
GFR calc Af Amer: >60 mL/min (ref >60)
GFR calc non Af Amer: >60 mL/min (ref >60)
Glucose, Bld: 100 mg/dL — ABNORMAL HIGH (ref 70–99)
Potassium: 3.9 mmol/L (ref 3.5–5.1)
Sodium: 136 mmol/L (ref 135–145)
Total Bilirubin: 0.7 mg/dL (ref 0.3–1.2)
Total Protein: 7.8 g/dL (ref 6.5–8.1)

## 2019-08-22 LAB — CBC
HCT: 40.7 % (ref 36.0–46.0)
Hemoglobin: 13.4 g/dL (ref 12.0–15.0)
MCH: 27.9 pg (ref 26.0–34.0)
MCHC: 32.9 g/dL (ref 30.0–36.0)
MCV: 84.8 fL (ref 80.0–100.0)
Platelets: 414 10*3/uL — ABNORMAL HIGH (ref 150–400)
RBC: 4.8 MIL/uL (ref 3.87–5.11)
RDW: 13 % (ref 11.5–15.5)
WBC: 10.6 10*3/uL — ABNORMAL HIGH (ref 4.0–10.5)
nRBC: 0 % (ref 0.0–0.2)

## 2019-08-22 LAB — BRAIN NATRIURETIC PEPTIDE: B Natriuretic Peptide: 76 pg/mL (ref 0.0–100.0)

## 2019-08-22 LAB — TSH: TSH: 1.598 u[IU]/mL (ref 0.350–4.500)

## 2019-08-22 LAB — VITAMIN D 25 HYDROXY (VIT D DEFICIENCY, FRACTURES): Vit D, 25-Hydroxy: 29.61 ng/mL — ABNORMAL LOW (ref 30–100)

## 2019-08-24 DIAGNOSIS — R0602 Shortness of breath: Secondary | ICD-10-CM

## 2019-08-24 HISTORY — DX: Shortness of breath: R06.02

## 2019-08-24 NOTE — Assessment & Plan Note (Signed)
This is a newer symptom reported. I do question changes post COVID- last xray was overall promising, but she might have some bronchospasms/asthma development. I will be prescribing an inhaler to see if that might help. It would be promising if so, to help [place] identify new asthma symptoms. At that time pulmonary referral might be good given hx of covid. Lungs clear today

## 2019-08-24 NOTE — Assessment & Plan Note (Signed)
PE unremarkable today. Encouraged exercise and self care. I also discussed breast pains that can sometimes seem like chest pains, and that some women can experience these. I have suggested 200 mg of vitamin e to see if that helps.

## 2019-08-24 NOTE — Assessment & Plan Note (Signed)
Reviewed hospital notes, labs, and orders.  At this time, I think cardiology referral would be best to see if a Holter monitor can capture the heart rate she is reporting. I will be getting updated lab work and ruling out electrolyte and or fluid as an concern. Patient acknowledged agreement and understanding of the plan.

## 2019-08-24 NOTE — Assessment & Plan Note (Signed)
PE again unremarkable, but palpitations can sometimes be harder to note unless on going and or experiencing at time of visit.  Again encouraged focus on eating, sleeping, and exercising.  Referral to cardiology was made to make sure all avenues are reviewed.

## 2019-08-24 NOTE — Assessment & Plan Note (Signed)
A1c check to rule out possible causes of symptoms.

## 2019-08-26 NOTE — Progress Notes (Signed)
CARDIOLOGY CONSULT NOTE       Patient ID: Christine Freeman MRN: [id number] DOB/AGE: 07-27-1995 24 y.o.  Admit date: (Not on file) Referring Physician: Arvilla Market NP Primary Physician: [name], NP Primary Cardiologist: new Reason for Consultation: Chest Pain  Active Problems:   * No active hospital problems. *   HPI:  25 y.o. referred by [name] for chest pain. Occurs since May associated with palpitations and dyspnea at times. She also has some weakness and "brain fog" with episodes. Rx with antibiotics for ? Bronchitis with some improvement Had COVID in December 2020  She carries a PMH of heart murmur. She was given an albuterol inhaler for her breathing    Seen in ER 08/14/19 with feeling like she can't get her breath Had left sided pain in axilla No help with NSAIDs CXR NAD ECG normal BNP normal Also seen 07/15/19 with pain ? 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 Heart murmur     Family History  Problem Relation Age of Onset  . Diabetes Maternal Aunt     Social History   Socioeconomic History  . Marital status: Significant Other    Spouse name: Donia Ast   . Number of children: Not on file  . Years of education: Not on file  . Highest education level: Master's degree (e.g., MA, MS, MEng, MEd, MSW, MBA)  Occupational History  . Not on file  Tobacco Use  . Smoking status:  Never Smoker  . Smokeless tobacco: Never Used  Vaping Use  . Vaping Use: Never used  Substance and Sexual Activity  . Alcohol use: Yes    Comment: socially   . Drug use: Never  . Sexual activity: Yes  Other Topics Concern  . Not on file  Social History Narrative   Lives with boyfriend, [name]      Enjoys: music, seeing cousins, shopping-[employer]       Diet: eat all food groups     Allergies: pineapple, eggplant, onion   Caffeine: once daily-working to do decaf   Water: 6-8 cups daily      Wears seat belt   Does not use phone while driving   Smoke detectors at home    No weapons    Social Determinants of Health   Financial Resource Strain: Low Risk   . Difficulty of Paying Living Expenses: Not hard at all  Food Insecurity: No Food Insecurity  . Worried About Programme researcher, broadcasting/film/video in the Last Year: Never true  . Ran Out of Food in the Last Year: Never true  Transportation Needs: No Transportation Needs  . Lack of Transportation (Medical): No  . Lack of Transportation (Non-Medical): No  Physical Activity: Insufficiently Active  . Days of Exercise per Week: 2 days  . Minutes of Exercise per Session: 30 min  Stress:   . Feeling of Stress :  Social Connections: Socially Isolated  . Frequency of Communication with Friends and Family: More than three times a week  . Frequency of Social Gatherings with Friends and Family: Once a week  . Attends Religious Services: Never  . Active Member of Clubs or Organizations: No  . Attends Banker Meetings: Never  . Marital Status: Never married  Intimate Partner Violence: Not At Risk  . Fear of Current or Ex-Partner: No  . Emotionally Abused: No  . Physically Abused: No  . Sexually Abused: No    History reviewed. No pertinent surgical history.    Current Outpatient Medications:  .  acetaminophen (TYLENOL) 325 MG tablet, Take 650 mg by mouth as needed., Disp: , Rfl:  .  albuterol (VENTOLIN HFA) 108 (90 Base)  MCG/ACT inhaler, Inhale 1-2 puffs into the lungs every 6 (six) hours as needed for wheezing or shortness of breath., Disp: 8 g, Rfl: 0 .  ibuprofen (ADVIL) 200 MG tablet, Take 200 mg by mouth every 6 (six) hours as needed., Disp: , Rfl:  .  Prenatal Multivit-Min-Fe-FA (PRE-NATAL FORMULA PO), Take 2 tablets by mouth daily., Disp: , Rfl:     Physical Exam: Blood pressure 102/72, pulse 84, height 4\' 11"  (1.499 m), weight 164 lb 3.2 oz (74.5 kg), last menstrual period 08/19/2019, SpO2 97 %.   Affect appropriate Healthy:  appears stated age HEENT: normal Neck supple with no adenopathy JVP normal no bruits no thyromegaly Lungs clear with no wheezing and good diaphragmatic motion Heart:  S1/S2 no murmur, no rub, gallop or click PMI normal Abdomen: benighn, BS positve, no tenderness, no AAA no bruit.  No HSM or HJR Distal pulses intact with no bruits No edema Neuro non-focal Skin warm and dry No muscular weakness   Labs:   Lab Results  Component Value Date   WBC 10.6 (H) 08/22/2019   HGB 13.4 08/22/2019   HCT 40.7 08/22/2019   MCV 84.8 08/22/2019   PLT 414 (H) 08/22/2019    No results for input(s): NA, K, CL, CO2, BUN, CREATININE, CALCIUM, PROT, BILITOT, ALKPHOS, ALT, AST, GLUCOSE in the last 168 hours.  Invalid input(s): LABALBU    Radiology: DG Chest 2 View  Result Date: 08/14/2019 CLINICAL DATA:  Chest pain EXAM: CHEST - 2 VIEW COMPARISON:  July 15, 2019 FINDINGS: Lungs are clear. The heart size and pulmonary vascularity are normal. No adenopathy. No pneumothorax. No bone lesions. IMPRESSION: Lungs clear.  Cardiac silhouette normal. Electronically Signed   By: July 17, 2019 III M.D.   On: 08/14/2019 11:05    EKG: SR rate 78 normal    ASSESSMENT AND PLAN:   1.  Chest Pain:  Atypical but recurrent with 3 ER visits f/u ETT 2. Dyspnea:  With normal BNP, ECG and CXR Rx with antibiotics and albuterol inhaler will order Echo to r/o structural heart disease  3. Anxiety :   Playing large role f/u with primary to consider RX 4. Palpitations :  realted to #3 14 day event monitor to r/o SVT/Atrial tachycardia   F/U with cardiology PrN if testing normal   Signed: 10/15/2019 08/29/2019, 11:09 AM

## 2019-08-29 ENCOUNTER — Other Ambulatory Visit: Payer: Self-pay

## 2019-08-29 ENCOUNTER — Ambulatory Visit (INDEPENDENT_AMBULATORY_CARE_PROVIDER_SITE_OTHER): Payer: Self-pay | Admitting: Cardiovascular Disease

## 2019-08-29 ENCOUNTER — Telehealth: Payer: Self-pay | Admitting: Radiology

## 2019-08-29 ENCOUNTER — Encounter: Payer: Self-pay | Admitting: Cardiovascular Disease

## 2019-08-29 VITALS — BP 102/72 | HR 84 | Ht 59.0 in | Wt 164.2 lb

## 2019-08-29 DIAGNOSIS — R002 Palpitations: Secondary | ICD-10-CM

## 2019-08-29 DIAGNOSIS — R0600 Dyspnea, unspecified: Secondary | ICD-10-CM

## 2019-08-29 DIAGNOSIS — R079 Chest pain, unspecified: Secondary | ICD-10-CM

## 2019-08-29 NOTE — Telephone Encounter (Signed)
Enrolled patient for a 14 day Zio monitor to be mailed to patients home.  

## 2019-08-29 NOTE — Patient Instructions (Addendum)
Medication Instructions:  *If you need a refill on your cardiac medications before your next appointment, please call your pharmacy*  Lab Work: If you have labs (blood work) drawn today and your tests are completely normal, you will receive your results only by: Marland Kitchen MyChart Message (if you have MyChart) OR . A paper copy in the mail If you have any lab test that is abnormal or we need to change your treatment, we will call you to review the results.  Testing/Procedures: Your physician has requested that you have an echocardiogram. Echocardiography is a painless test that uses sound waves to create images of your heart. It provides your doctor with information about the size and shape of your heart and how well your heart's chambers and valves are working. This procedure takes approximately one hour. There are no restrictions for this procedure.  Your physician has requested that you have an exercise tolerance test. For further information please visit [email or web address] Please also follow instruction sheet, as given.  Your physician has recommended that you wear an event monitor 14 days. Event monitors are medical devices that record the heart's electrical activity. Doctors most often [place] these monitors to diagnose arrhythmias. Arrhythmias are problems with the speed or rhythm of the heartbeat. The monitor is a small, portable device. You can wear one while you do your normal daily activities. This is usually used to diagnose what is causing palpitations/syncope (passing out).   ZIO XT- Long Term Monitor Instructions   Your physician has requested you wear your ZIO patch monitor___14____days.   This is a single patch monitor.  Irhythm supplies one patch monitor per enrollment.  Additional stickers are not available.   Please do not apply patch if you will be having a Nuclear Stress Test, Echocardiogram, Cardiac CT, MRI, or Chest Xray during the time frame you would be wearing the monitor. The  patch cannot be worn during these tests.  You cannot remove and re-apply the ZIO XT patch monitor.   Your ZIO patch monitor will be sent USPS Priority mail from [facility] directly to your home address. The monitor may also be mailed to a PO BOX if home delivery is not available.   It may take 3-5 days to receive your monitor after you have been enrolled.   Once you have received you monitor, please review enclosed instructions.  Your monitor has already been registered assigning a specific monitor serial # to you.   Applying the monitor   Shave hair from upper left chest.   Hold abrader disc by orange tab.  Rub abrader in 40 strokes over left upper chest as indicated in your monitor instructions.   Clean area with 4 enclosed alcohol pads .  Use all pads to assure are is cleaned thoroughly.  Let dry.   Apply patch as indicated in monitor instructions.  Patch will be place under collarbone on left side of chest with arrow pointing upward.   Rub patch adhesive wings for 2 minutes.Remove white label marked "1".  Remove white label marked "2".  Rub patch adhesive wings for 2 additional minutes.   While looking in a mirror, press and release button in center of patch.  A small green light will flash 3-4 times .  This will be your only indicator the monitor has been turned on.     Do not shower for the first 24 hours.  You may shower after the first 24 hours.   Press button if you feel a symptom.  You will hear a small click.  Record Date, Time and Symptom in the Patient Log Book.   When you are ready to remove patch, follow instructions on last 2 pages of Patient Log Book.  Stick patch monitor onto last page of Patient Log Book.   Place Patient Log Book in [place] box.  Use locking tab on box and tape box closed securely.  The [employer] and [employer] has [employer] on it.  Please place in mailbox as soon as possible.  Your physician should have your test results approximately 7 days  after the monitor has been mailed back to [facility].   Call [facility] Customer Care at [phone] if you have questions regarding your ZIO XT patch monitor.  Call them immediately if you see an orange light blinking on your monitor.   If your monitor falls off in less than 4 days contact our Monitor department at [phone].  If your monitor becomes loose or falls off after 4 days call [employer] at [phone] for suggestions on securing your monitor.   Follow-Up: At [facility], you and your health needs are our priority.  As part of our continuing mission to provide you with exceptional heart care, we have created designated Provider Care Teams.  These Care Teams include your primary Cardiologist (physician) and Advanced Practice Providers (APPs -  Physician Assistants and Nurse Practitioners) who all work together to provide you with the care you need, when you need it.  We recommend signing up for the patient portal called "MyChart".  Sign up information is provided on this After Visit Summary.  MyChart is used to connect with patients for Virtual Visits (Telemedicine).  Patients are able to view lab/test results, encounter notes, upcoming appointments, etc.  Non-urgent messages can be sent to your provider as well.   To learn more about what you can do with MyChart, go to [email or web address].    Your next appointment:   As needed  The format for your next appointment:   In Person  Provider:   You may see Dr. Eden Emms or one of the following Advanced Practice Providers on your designated Care Team:    Norma Fredrickson, NP  Nada Boozer, NP  Georgie Chard, NP

## 2019-09-02 ENCOUNTER — Other Ambulatory Visit (INDEPENDENT_AMBULATORY_CARE_PROVIDER_SITE_OTHER): Payer: Self-pay

## 2019-09-02 DIAGNOSIS — R079 Chest pain, unspecified: Secondary | ICD-10-CM

## 2019-09-02 DIAGNOSIS — R002 Palpitations: Secondary | ICD-10-CM

## 2019-09-02 DIAGNOSIS — R0600 Dyspnea, unspecified: Secondary | ICD-10-CM

## 2019-09-09 ENCOUNTER — Ambulatory Visit (INDEPENDENT_AMBULATORY_CARE_PROVIDER_SITE_OTHER): Payer: Self-pay

## 2019-09-09 ENCOUNTER — Other Ambulatory Visit: Payer: Self-pay

## 2019-09-09 DIAGNOSIS — R079 Chest pain, unspecified: Secondary | ICD-10-CM

## 2019-09-09 DIAGNOSIS — R0600 Dyspnea, unspecified: Secondary | ICD-10-CM

## 2019-09-09 LAB — EXERCISE TOLERANCE TEST
Estimated workload: 7 METS
Exercise duration (min): 5 min
Exercise duration (sec): 11 s
MPHR: 195 {beats}/min
Peak HR: 176 {beats}/min
Percent HR: 90 %
RPE: 15
Rest HR: 95 {beats}/min

## 2019-09-10 ENCOUNTER — Ambulatory Visit (HOSPITAL_COMMUNITY): Payer: Self-pay | Attending: Cardiovascular Disease

## 2019-09-10 DIAGNOSIS — R079 Chest pain, unspecified: Secondary | ICD-10-CM | POA: Insufficient documentation

## 2019-09-10 DIAGNOSIS — R0600 Dyspnea, unspecified: Secondary | ICD-10-CM | POA: Insufficient documentation

## 2019-09-10 LAB — ECHOCARDIOGRAM COMPLETE
Area-P 1/2: 5.75 cm2
S' Lateral: 2.7 cm

## 2019-09-15 ENCOUNTER — Telehealth: Payer: Self-pay | Admitting: Cardiovascular Disease

## 2019-09-15 NOTE — Telephone Encounter (Signed)
Transferred call to Pam  

## 2019-09-26 ENCOUNTER — Ambulatory Visit: Payer: Self-pay | Admitting: Cardiology

## 2019-10-01 ENCOUNTER — Other Ambulatory Visit: Payer: Self-pay

## 2019-10-01 ENCOUNTER — Ambulatory Visit
Admission: EM | Admit: 2019-10-01 | Discharge: 2019-10-01 | Disposition: A | Discharge status: Home / Self Care | Payer: Self-pay | Attending: Emergency Medicine | Admitting: Emergency Medicine

## 2019-10-01 DIAGNOSIS — Z1152 Encounter for screening for COVID-19: Secondary | ICD-10-CM

## 2019-10-01 NOTE — ED Triage Notes (Signed)
Pt here for covid test for work, no symptoms no exposure

## 2019-10-02 ENCOUNTER — Other Ambulatory Visit: Payer: Self-pay

## 2019-10-02 DIAGNOSIS — Z5321 Procedure and treatment not carried out due to patient leaving prior to being seen by health care provider: Secondary | ICD-10-CM | POA: Insufficient documentation

## 2019-10-02 DIAGNOSIS — M542 Cervicalgia: Secondary | ICD-10-CM | POA: Insufficient documentation

## 2019-10-02 DIAGNOSIS — R0602 Shortness of breath: Secondary | ICD-10-CM | POA: Insufficient documentation

## 2019-10-02 DIAGNOSIS — M549 Dorsalgia, unspecified: Secondary | ICD-10-CM | POA: Insufficient documentation

## 2019-10-02 DIAGNOSIS — R202 Paresthesia of skin: Secondary | ICD-10-CM | POA: Insufficient documentation

## 2019-10-03 ENCOUNTER — Encounter (HOSPITAL_COMMUNITY): Payer: Self-pay | Admitting: Emergency Medicine

## 2019-10-03 ENCOUNTER — Other Ambulatory Visit: Payer: Self-pay

## 2019-10-03 ENCOUNTER — Emergency Department (HOSPITAL_COMMUNITY)
Admission: EM | Admit: 2019-10-03 | Discharge: 2019-10-03 | Disposition: A | Discharge status: Home / Self Care | Payer: Self-pay | Attending: Emergency Medicine | Admitting: Emergency Medicine

## 2019-10-03 ENCOUNTER — Encounter: Payer: Self-pay | Admitting: Family Medicine

## 2019-10-03 LAB — SARS-COV-2, NAA 2 DAY TAT

## 2019-10-03 LAB — NOVEL CORONAVIRUS, NAA: SARS-CoV-2, NAA: NOT DETECTED

## 2019-10-03 NOTE — ED Triage Notes (Signed)
Pt presents with multiple complaints at this time. Pt c/o shortness of breath, neck pain, back pain, and tingling in her feet.

## 2019-10-09 ENCOUNTER — Ambulatory Visit (INDEPENDENT_AMBULATORY_CARE_PROVIDER_SITE_OTHER): Payer: Self-pay | Admitting: Family Medicine

## 2019-10-09 ENCOUNTER — Encounter: Payer: Self-pay | Admitting: Family Medicine

## 2019-10-09 ENCOUNTER — Other Ambulatory Visit (HOSPITAL_COMMUNITY)
Admission: RE | Admit: 2019-10-09 | Discharge: 2019-10-09 | Disposition: A | Discharge status: Home / Self Care | Payer: BC Managed Care – PPO | Source: Ambulatory Visit | Attending: Adult Health | Admitting: Adult Health

## 2019-10-09 ENCOUNTER — Encounter: Payer: Self-pay | Admitting: Adult Health

## 2019-10-09 ENCOUNTER — Ambulatory Visit (INDEPENDENT_AMBULATORY_CARE_PROVIDER_SITE_OTHER): Payer: BC Managed Care – PPO | Admitting: Adult Health

## 2019-10-09 ENCOUNTER — Other Ambulatory Visit: Payer: Self-pay

## 2019-10-09 VITALS — BP 124/82 | HR 92 | Temp 97.8°F | Resp 18 | Ht 59.0 in | Wt 162.4 lb

## 2019-10-09 VITALS — BP 108/67 | HR 82 | Ht <= 58 in | Wt 161.0 lb

## 2019-10-09 DIAGNOSIS — Z124 Encounter for screening for malignant neoplasm of cervix: Secondary | ICD-10-CM | POA: Insufficient documentation

## 2019-10-09 DIAGNOSIS — E6609 Other obesity due to excess calories: Secondary | ICD-10-CM

## 2019-10-09 DIAGNOSIS — Z3202 Encounter for pregnancy test, result negative: Secondary | ICD-10-CM

## 2019-10-09 DIAGNOSIS — Z789 Other specified health status: Secondary | ICD-10-CM

## 2019-10-09 DIAGNOSIS — R1032 Left lower quadrant pain: Secondary | ICD-10-CM | POA: Insufficient documentation

## 2019-10-09 DIAGNOSIS — N946 Dysmenorrhea, unspecified: Secondary | ICD-10-CM

## 2019-10-09 DIAGNOSIS — R0789 Other chest pain: Secondary | ICD-10-CM

## 2019-10-09 DIAGNOSIS — Z01419 Encounter for gynecological examination (general) (routine) without abnormal findings: Secondary | ICD-10-CM | POA: Diagnosis not present

## 2019-10-09 DIAGNOSIS — Z6833 Body mass index (BMI) 33.0-33.9, adult: Secondary | ICD-10-CM

## 2019-10-09 HISTORY — DX: Encounter for pregnancy test, result negative: Z32.02

## 2019-10-09 LAB — POCT URINE PREGNANCY: Preg Test, Ur: NEGATIVE

## 2019-10-09 NOTE — Assessment & Plan Note (Signed)
PE unremarkable today, not have any chest pain today.  I have encouraged the increased use of yoga/stretching/exercise and self-care.  She is taking vitamin D and her prenatals and other vitamins as directed.

## 2019-10-09 NOTE — Progress Notes (Signed)
Patient ID: Christine Freeman, female   DOB: [birth year], 24 y.o.   MRN: [id number] History of Present Illness:  Christine Freeman is a 24 year old, Asian female, single with SO, G0P0 in complaining of painful periods since January 2020, and vaginal spasms at times and has pain with sex, esp a the start.  Has had left side pain lately. Periods last 4-6 days can heavy maybe 3, may change pad every 5 hours.   She has never had a pap, and is wondering about her hormones.  Has had chest pains and pain under left arm and was seen by cardiologist, with negative work up she days. She had COVID. She is chorus Runner, broadcasting/film/video in [place].  PCP is Christine Coop NP.  Current Medications, Allergies, Past Medical History, Past Surgical History, Family History and Social History were reviewed in [employer] record.     Review of Systems: Patient denies any headaches, hearing loss, fatigue, blurred vision, shortness of breath,  problems with bowel movements, or urination.No joint pain or mood swings. See HPI for positives.    Physical Exam:BP 108/67 (BP Location: Left Arm, Patient Position: Sitting, Cuff Size: Normal)    Pulse 82    Ht 4\' 10"  (1.473 m)    Wt 161 lb (73 kg)    LMP 09/17/2019    BMI 33.65 kg/m  UPT is negative.  General:  Well developed, well nourished, no acute distress Skin:  Warm and dry Lungs; Clear to auscultation bilaterally Cardiovascular: RRR, no mumur heard today Pelvic:  External genitalia is normal in appearance, no lesions.  The vagina is normal in appearance. Urethra has no lesions or masses. The cervix is nulliparous and smooth, pap with GC/CHL and high risk genotyping performed.  Uterus is felt to be normal size, shape, and contour,mildly tender.  No adnexal masses, + LLQ tenderness noted.Bladder is non tender, no masses felt. Extremities/musculoskeletal:  No swelling or varicosities noted, no clubbing or cyanosis Psych:  No mood changes, alert and cooperative,seems happy AA is  0 Fall risk is low PHQ 9 score is 0  Examination chaperoned by 11/17/2019 LPN   Upstream - 10/09/19 1005      Pregnancy Intention Screening   Does the patient want to become pregnant in the next year? No    Does the patient's partner want to become pregnant in the next year? No    Would the patient like to discuss contraceptive options today? Yes      Contraception Wrap Up   Current Method Female Condom    End Method Female Condom    Contraception Counseling Provided Yes           Impression and Plan: 1. Pregnancy examination or test, negative result  2. Routine cervical smear Pap sent  3. Encounter for gynecological examination with Papanicolaou smear of cervix Pap sent  Pap in 3 years if normal Physical with PCP Labs with PCP   4. LLQ pain Will get GYN 12/09/19 to assess uterus and ovaries   5. Painful menstrual periods Will get GYN [place]  Discussed trying OCs, will talk after [place] results back   Try using good lubricate with sex and increased foreplay

## 2019-10-09 NOTE — Assessment & Plan Note (Signed)
Improved  Christine Freeman is educated about the importance of exercise daily to help with weight management. A minumum of 30 minutes daily is recommended. Additionally, importance of healthy food choices  with portion control discussed.  Wt Readings from Last 3 Encounters:  10/09/19 161 lb (73 kg)  10/09/19 162 lb 6.4 oz (73.7 kg)  10/03/19 165 lb (74.8 kg)

## 2019-10-09 NOTE — Progress Notes (Signed)
Subjective:  Patient ID: Christine Freeman, female    DOB: [birth year]  Age: 24 y.o. MRN: [id number]  CC:  Chief Complaint  Patient presents with  . Follow-up    pt is feeling better as far as chest pains she would like to discuss vitamins as she thinks the prenatal vitamins are a little much       HPI  HPI  Christine Freeman is a 24 year old female patient who presents today for follow-up regarding recent chest pain experience.  As per previous visits she has experienced atypical chest pain several times and has been in the emergency room several times.  Most recently she went to the emergency room on August 26 but ended up leaving as she was going to have a several hour wait.  She reports that in the month of August she felt most normal since April.  At her last visit with me she started taking magnesium, vitamin E, D along with her prenatal that I suggested.  The vitamins she feels helped tremendously with her fatigue, weakness and palpitations.  She still felt chest pains but they were more manageable and not interactive of her day.  She reports unfortunately the last week of August she was having a very tough time.  Heart was pounding harder than usual and parts of her body felt like they were warm on fire at times.  She started having discomfort in her calfs on Sunday and thought that was because she wore heels that week but now she was feeling in her upper back neck and ears which prompted her to go to the emergency room.  However she ended up leaving due to the almost 4-hour wait in the lobby.  She wore heart monitor still waiting on the data to come back.  But most of her cardiology work-up has been unremarkable.  She reports today that if she takes her vitamins she feels much better.  And she just wanted to make sure that she was taking the right vitamins and if she needed anything else.  She additionally reports that she will be seeing her GYN and getting her first Pap smear today.  She  denies having trouble sleeping.  Denies having trouble eating or going to the bathroom.  Denies having any falls or injuries.  Reports feeling safe at home.  She does have 2 skin change colorations on her stomach abdomen area however she reports she is not been laying out in the sun.  She is unsure what has caused them.  They are not giving her any trouble they are not itchy they are not painful there is just a slight darkening of the skin.  Denies having any hearing or vision changes.  Today in the office she denies having any active chest pain, palpitations, leg swelling, dizziness, vision changes, cough, shortness of breath, fever or chills.   Today patient denies signs and symptoms of COVID 19 infection including fever, chills, cough, shortness of breath, and headache. Past Medical, Surgical, Social History, Allergies, and Medications have been Reviewed.   Past Medical History:  Diagnosis Date  . COVID-19   . Family history of diabetes mellitus 07/18/2019  . Family history of lung disease 07/18/2019  . Heart murmur   . History of heart murmur in childhood 07/18/2019  . Palpitations with regular cardiac rhythm 07/18/2019  . Pregnancy examination or test, negative result 10/09/2019  . Shortness of breath 08/24/2019    Current Meds  Medication Sig  .  acetaminophen (TYLENOL) 325 MG tablet Take 650 mg by mouth as needed.  Marland Kitchen albuterol (VENTOLIN HFA) 108 (90 Base) MCG/ACT inhaler Inhale 1-2 puffs into the lungs every 6 (six) hours as needed for wheezing or shortness of breath.  . Cholecalciferol (VITAMIN D3) 75 MCG (3000 UT) TABS Take 50 mcg by mouth once.  Marland Kitchen ibuprofen (ADVIL) 200 MG tablet Take 200 mg by mouth every 6 (six) hours as needed.  . Magnesium 250 MG TABS Take 250 mg by mouth once.  . Prenatal Multivit-Min-Fe-FA (PRE-NATAL FORMULA PO) Take 2 tablets by mouth daily.  . vitamin E 200 UNIT capsule Take 200 Units by mouth daily.    ROS:  Review of Systems  Constitutional: Negative.     HENT: Negative.   Eyes: Negative.   Respiratory: Negative.   Cardiovascular: Negative.   Gastrointestinal: Negative.   Genitourinary: Negative.   Musculoskeletal: Negative.   Skin: Negative.   Neurological: Negative.   Endo/Heme/Allergies: Negative.   Psychiatric/Behavioral: Negative.      Objective:   Today's Vitals: BP 124/82 (BP Location: Right Arm, Patient Position: Sitting, Cuff Size: Normal)   Pulse 92   Temp 97.8 F (36.6 C) (Temporal)   Resp 18   Ht 4\' 11"  (1.499 m)   Wt 162 lb 6.4 oz (73.7 kg)   LMP 09/17/2019   SpO2 98%   BMI 32.80 kg/m  Vitals with BMI 10/09/2019 10/09/2019 10/03/2019  Height 4\' 10"  4\' 11"  4\' 11"   Weight 161 lbs 162 lbs 6 oz 165 lbs  BMI 33.66 32.78 33.31  Systolic 108 124 10/05/2019  Diastolic 67 82 47  Pulse 82 92 85     Physical Exam Vitals and nursing note reviewed.  Constitutional:      Appearance: Normal appearance. She is well-developed and well-groomed. She is obese.  HENT:     Head: Normocephalic and atraumatic.     Right Ear: External ear normal.     Left Ear: External ear normal.     Mouth/Throat:     Comments: Mask in place Eyes:     General:        Right eye: No discharge.        Left eye: No discharge.     Conjunctiva/sclera: Conjunctivae normal.  Cardiovascular:     Rate and Rhythm: Normal rate and regular rhythm.     Pulses: Normal pulses.     Heart sounds: Normal heart sounds.  Pulmonary:     Effort: Pulmonary effort is normal.     Breath sounds: Normal breath sounds.  Musculoskeletal:        General: Normal range of motion.     Cervical back: Normal range of motion and neck supple.  Skin:    General: Skin is warm.     Comments: 2 small around the size of a quarter to half dollar increased pigmentation of skin on the left side of her abdomen.  No irritation no infection  Neurological:     General: No focal deficit present.     Mental Status: She is alert and oriented to person, place, and time.  Psychiatric:         Attention and Perception: Attention and perception normal.        Mood and Affect: Mood and affect normal.        Speech: Speech normal.        Behavior: Behavior normal. Behavior is cooperative.        Thought Content: Thought content normal.  Cognition and Memory: Cognition and memory normal.        Judgment: Judgment normal.     Comments: Good communication, pleasant in communication good eye contact     Assessment   1. Class 1 obesity due to excess calories without serious comorbidity with body mass index (BMI) of 33.0 to 33.9 in adult   2. Atypical chest pain   3. Takes dietary supplements     Tests ordered No orders of the defined types were placed in this encounter.   Plan: Please see assessment and plan per problem list above.   No orders of the defined types were placed in this encounter.   Patient to follow-up in 6 months for CPE  [name], NP

## 2019-10-09 NOTE — Patient Instructions (Signed)
I appreciate the opportunity to provide you with care for your health and wellness. Today we discussed: recent chest pains and vitamins   Follow up: 6 months for CPE   No labs or referrals today  Glad you are feeling better. Please continue to take good care of yourself :)  Please continue to practice social distancing to keep you, your family, and our community safe.  If you must go out, please wear a mask and practice good handwashing.  It was a pleasure to see you and I look forward to continuing to work together on your health and well-being. Please do not hesitate to call the office if you need care or have questions about your care.  Have a wonderful day and week. With Gratitude, Tereasa Coop, DNP, AGNP-BC

## 2019-10-09 NOTE — Assessment & Plan Note (Signed)
I encouraged her to stay on her prenatal vitamin.  I do not feel like she needs extra magnesium at this time.  The vitamin D does not hurt as she does not get much dairy or exercise weightbearing activity.  In addition to the vitamin E if she continues to have the breast pain.  Reviewed side effects, risks and benefits of medication.   Patient acknowledged agreement and understanding of the plan.

## 2019-10-10 LAB — CYTOLOGY - PAP
Chlamydia: NEGATIVE
Comment: NEGATIVE
Comment: NEGATIVE
Comment: NORMAL
Diagnosis: NEGATIVE
High risk HPV: NEGATIVE
Neisseria Gonorrhea: NEGATIVE

## 2019-10-24 ENCOUNTER — Other Ambulatory Visit: Payer: Self-pay | Admitting: *Deleted

## 2019-10-24 DIAGNOSIS — N946 Dysmenorrhea, unspecified: Secondary | ICD-10-CM

## 2019-10-24 DIAGNOSIS — R1032 Left lower quadrant pain: Secondary | ICD-10-CM

## 2019-10-29 ENCOUNTER — Ambulatory Visit
Admission: EM | Admit: 2019-10-29 | Discharge: 2019-10-29 | Disposition: A | Discharge status: Home / Self Care | Payer: BC Managed Care – PPO | Attending: Emergency Medicine | Admitting: Emergency Medicine

## 2019-10-29 ENCOUNTER — Other Ambulatory Visit: Payer: BC Managed Care – PPO

## 2019-10-29 ENCOUNTER — Other Ambulatory Visit: Payer: Self-pay

## 2019-10-29 DIAGNOSIS — Z1152 Encounter for screening for COVID-19: Secondary | ICD-10-CM | POA: Diagnosis not present

## 2019-10-29 NOTE — ED Triage Notes (Signed)
Needs covid test

## 2019-10-31 ENCOUNTER — Ambulatory Visit (HOSPITAL_COMMUNITY): Admission: RE | Admit: 2019-10-31 | Payer: BC Managed Care – PPO | Source: Ambulatory Visit

## 2019-10-31 LAB — SARS-COV-2, NAA 2 DAY TAT

## 2019-10-31 LAB — NOVEL CORONAVIRUS, NAA: SARS-CoV-2, NAA: NOT DETECTED

## 2019-11-03 ENCOUNTER — Ambulatory Visit (HOSPITAL_COMMUNITY)
Admission: RE | Admit: 2019-11-03 | Discharge: 2019-11-03 | Disposition: A | Discharge status: Home / Self Care | Payer: BC Managed Care – PPO | Source: Ambulatory Visit | Attending: Adult Health | Admitting: Adult Health

## 2019-11-03 ENCOUNTER — Other Ambulatory Visit: Payer: Self-pay

## 2019-11-03 DIAGNOSIS — R1032 Left lower quadrant pain: Secondary | ICD-10-CM | POA: Diagnosis not present

## 2019-11-03 DIAGNOSIS — N946 Dysmenorrhea, unspecified: Secondary | ICD-10-CM | POA: Diagnosis not present

## 2019-11-06 ENCOUNTER — Telehealth: Payer: Self-pay | Admitting: *Deleted

## 2019-11-06 ENCOUNTER — Other Ambulatory Visit: Payer: Self-pay | Admitting: Women's Health

## 2019-11-06 ENCOUNTER — Telehealth: Payer: Self-pay | Admitting: Women's Health

## 2019-11-06 MED ORDER — LO LOESTRIN FE 1 MG-10 MCG / 10 MCG PO TABS: 1.0000 | ORAL_TABLET | Freq: Every day | ORAL | 3 refills | Status: AC

## 2019-11-06 NOTE — Telephone Encounter (Signed)
Telephoned patient at home number and advised [place] are normal. Patient does want to start birth control. Patient states does having migraines without aura no history of  blood clots legs/lungs, MI/CVA/HTN, smoker. Advised patient would send to provider and to check with pharmacy later.

## 2019-11-06 NOTE — Telephone Encounter (Signed)
Telephoned patient at home number and left message to return call.  

## 2019-11-06 NOTE — Telephone Encounter (Signed)
Patient called stating that she was returning the nurses phone call, please contact pt

## 2019-11-11 ENCOUNTER — Telehealth: Payer: Self-pay | Admitting: Women's Health

## 2019-11-11 NOTE — Telephone Encounter (Signed)
Telephoned patient at home number and patient wants to know if can vitamins with birth control. Advised patient was allowed to take vitamins. Patient voiced understanding.

## 2019-11-11 NOTE — Telephone Encounter (Signed)
Pt just picked up her birth control and wants to know if some over the counter meds is okay to take with them

## 2019-12-01 ENCOUNTER — Telehealth: Payer: Self-pay | Admitting: *Deleted

## 2019-12-01 ENCOUNTER — Telehealth: Payer: Self-pay | Admitting: Adult Health

## 2019-12-01 NOTE — Telephone Encounter (Signed)
Patient called to let us know that she has stopped taking her BCP as she is having side effects like anxiety, extreme fatigue, change in appetite and mood swings. At this time she does not want to take anything else for her periods.  Advised to let us know if she changes her mind.

## 2019-12-01 NOTE — Telephone Encounter (Signed)
Patient calling to ask if it is OK for her to stop her [facility] States she started it 2 weeks ago, last week had bad side effects like anxiety, extreme fatigue & it messed with her appetite  Please advise & call pt

## 2020-01-14 ENCOUNTER — Encounter: Payer: Self-pay | Admitting: Family Medicine

## 2020-01-14 ENCOUNTER — Ambulatory Visit (INDEPENDENT_AMBULATORY_CARE_PROVIDER_SITE_OTHER): Payer: BC Managed Care – PPO | Admitting: Family Medicine

## 2020-01-14 ENCOUNTER — Other Ambulatory Visit: Payer: Self-pay

## 2020-01-14 VITALS — BP 114/70 | HR 112 | Temp 98.0°F | Ht 59.0 in | Wt 159.0 lb

## 2020-01-14 DIAGNOSIS — R0789 Other chest pain: Secondary | ICD-10-CM | POA: Diagnosis not present

## 2020-01-14 DIAGNOSIS — M549 Dorsalgia, unspecified: Secondary | ICD-10-CM | POA: Diagnosis not present

## 2020-01-14 NOTE — Assessment & Plan Note (Signed)
PE without S&S of chest pain today. No flutters or changes in HR. I have again encouraged her to focus on self care relaxation techniques. Including but not limited to yoga, stretching, exercise.  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 I do question and underlying anxiety variable and discussed this with her.  Again emphasizing the above to help.

## 2020-01-14 NOTE — Progress Notes (Signed)
Subjective:  Patient ID: Christine Freeman, female    DOB: Jul 10, 1995  Age: 24 y.o. MRN: [id number]  CC:  Chief Complaint  Patient presents with  . Back Pain    back/neck pain started back after she started playing her flute again x1 week ago..this has happened in the past  . Chest Pain    feels tightness since she has started playing her flute again x1 week ago      HPI  HPI  Christine Freeman is a 24 year old female patient who presents today with back pain and chest tightness and flutter of heart rate. She has been seen several times for these over the course of the year.  She experiences the back and neck pain with playing the flute. She started playing it again a week ago. She reports this pain post playing. In the past we have discussed self care techniques to help with muscle relaxing. Has not tried modifying factors as of yet. Would like referral to the Ortho as she thinks maybe she has caused an issue with her back from playing for years.  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 I do question and underlying anxiety variable and discussed this with her. Again emphasizing self care items to help. As she has been doing well for several months when she was not playing the flute.   She denies chest tightness or pain today in the office.  Today patient denies signs and symptoms of COVID 19 infection including fever, chills, cough, shortness of breath, and headache. Past Medical, Surgical, Social History, Allergies, and Medications have been Reviewed.   Past Medical History:  Diagnosis Date  . COVID-19   . Family history of diabetes mellitus 07/18/2019  . Family history of lung disease 07/18/2019  . Heart murmur   . History of heart murmur in childhood 07/18/2019  . Palpitations with regular cardiac rhythm 07/18/2019  . Pregnancy examination or test, negative result 10/09/2019  . Shortness of breath  08/24/2019    Current Meds  Medication Sig  . acetaminophen (TYLENOL) 325 MG tablet Take 650 mg by mouth as needed.  Marland Kitchen albuterol (VENTOLIN HFA) 108 (90 Base) MCG/ACT inhaler Inhale 1-2 puffs into the lungs every 6 (six) hours as needed for wheezing or shortness of breath.  . Cholecalciferol (VITAMIN D3) 75 MCG (3000 UT) TABS Take 50 mcg by mouth once.  Marland Kitchen ibuprofen (ADVIL) 200 MG tablet Take 200 mg by mouth every 6 (six) hours as needed.  . LO LOESTRIN FE 1 MG-10 MCG / 10 MCG tablet Take 1 tablet by mouth daily.  . Magnesium 250 MG TABS Take 250 mg by mouth once.  . Prenatal Multivit-Min-Fe-FA (PRE-NATAL FORMULA PO) Take 2 tablets by mouth daily.  . vitamin E 200 UNIT capsule Take 200 Units by mouth daily.    ROS:  Review of Systems  Constitutional: Negative.   HENT: Negative.   Eyes: Negative.   Respiratory: Negative.   Cardiovascular: Positive for palpitations.  Gastrointestinal: Negative.   Genitourinary: Negative.   Musculoskeletal: Positive for back pain and neck pain.  Skin: Negative.   Neurological: Negative.   Endo/Heme/Allergies: Negative.   Psychiatric/Behavioral: Negative.     Objective:   Today's Vitals: BP 114/70 (BP Location: Right Arm, Patient Position: Sitting, Cuff Size: Normal)   Pulse (!) 112   Temp 98 F (36.7 C) (Temporal)  Ht 4\' 11"  (1.499 m)   Wt 159 lb (72.1 kg)   LMP 12/20/2019   SpO2 100%   BMI 32.11 kg/m  Vitals with BMI 01/14/2020 10/29/2019 10/09/2019  Height 4\' 11"  - 4\' 10"   Weight 159 lbs - 161 lbs  BMI 32.1 - 33.66  Systolic 114 119 12/09/2019  Diastolic 70 84 67  Pulse 112 84 82     Physical Exam Vitals and nursing note reviewed.  Constitutional:      Appearance: Normal appearance. She is obese.  HENT:     Head: Normocephalic and atraumatic.     Right Ear: External ear normal.     Left Ear: External ear normal.     Mouth/Throat:     Comments: Mask in place  Eyes:     General:        Right eye: No discharge.        Left eye: No  discharge.     Conjunctiva/sclera: Conjunctivae normal.  Cardiovascular:     Rate and Rhythm: Normal rate and regular rhythm.  No extrasystoles are present.    Pulses: Normal pulses.     Heart sounds: Normal heart sounds.  Pulmonary:     Effort: Pulmonary effort is normal.     Breath sounds: Normal breath sounds.  Musculoskeletal:     Cervical back: Normal range of motion and neck supple.     Comments: MAE, ROM intact   Skin:    General: Skin is warm.  Neurological:     General: No focal deficit present.     Mental Status: She is alert and oriented to person, place, and time.  Psychiatric:        Mood and Affect: Mood normal.        Behavior: Behavior normal.        Thought Content: Thought content normal.        Judgment: Judgment normal.     Assessment   1. Other acute back pain   2. Atypical chest pain     Tests ordered Orders Placed This Encounter  Procedures  . Ambulatory referral to Orthopedic Surgery     Plan: Please see assessment and plan per problem list above.   No orders of the defined types were placed in this encounter.   Patient to follow-up in as scheduled   Note: This dictation was prepared with Dragon dictation along with smaller phrase technology. Similar sounding words can be transcribed inadequately or may not be corrected upon review. Any transcriptional errors that result from this process are unintentional.      , NP

## 2020-01-14 NOTE — Patient Instructions (Signed)
  I appreciate the opportunity to provide you with care for your health and wellness. Today we discussed: back pain and fluttering sensation of heart   Follow up: 04/07/2020 as scheduled or as needed  No labs  Referrals today: Ortho for back   Remember to relax and stretch well pre and post playing the flute.  Merry Christmas and Happy New Year!   Please continue to practice social distancing to keep you, your family, and our community safe.  If you must go out, please wear a mask and practice good handwashing.  It was a pleasure to see you and I look forward to continuing to work together on your health and well-being. Please do not hesitate to call the office if you need care or have questions about your care.  Have a wonderful day. With Gratitude, Tereasa Coop, DNP, AGNP-BC

## 2020-01-14 NOTE — Assessment & Plan Note (Signed)
Back and Neck pain with playing the flute. She started playing a week ago. She has experienced this in the past. Has not tried modifying factors as of yet, but is encouraged to stretch pre and post playing flute as well as use OTC tylenol and Ibuprofen for pain.  Referral to ortho made

## 2020-01-16 ENCOUNTER — Ambulatory Visit: Payer: Self-pay | Admitting: Family Medicine

## 2020-02-03 ENCOUNTER — Encounter: Payer: Self-pay | Admitting: Orthopedic Surgery

## 2020-04-07 ENCOUNTER — Encounter: Payer: Self-pay | Admitting: Family Medicine

## 2020-11-03 DIAGNOSIS — T781XXS Other adverse food reactions, not elsewhere classified, sequela: Secondary | ICD-10-CM | POA: Diagnosis not present

## 2020-11-03 DIAGNOSIS — M25519 Pain in unspecified shoulder: Secondary | ICD-10-CM | POA: Diagnosis not present

## 2020-11-03 DIAGNOSIS — Z6835 Body mass index (BMI) 35.0-35.9, adult: Secondary | ICD-10-CM | POA: Diagnosis not present

## 2020-11-17 DIAGNOSIS — M25519 Pain in unspecified shoulder: Secondary | ICD-10-CM | POA: Diagnosis not present

## 2020-11-17 DIAGNOSIS — M9907 Segmental and somatic dysfunction of upper extremity: Secondary | ICD-10-CM | POA: Diagnosis not present

## 2020-11-17 DIAGNOSIS — M9902 Segmental and somatic dysfunction of thoracic region: Secondary | ICD-10-CM | POA: Diagnosis not present

## 2020-11-17 DIAGNOSIS — M9903 Segmental and somatic dysfunction of lumbar region: Secondary | ICD-10-CM | POA: Diagnosis not present

## 2021-01-07 DIAGNOSIS — J069 Acute upper respiratory infection, unspecified: Secondary | ICD-10-CM | POA: Diagnosis not present

## 2021-03-12 IMAGING — DX DG CHEST 1V
1 series · 1 of 1 positions shown · non-contrast
Comparison: 05/24/2019

CLINICAL DATA: Left-sided chest pain

EXAM:
CHEST  1 VIEW

[chest ap]
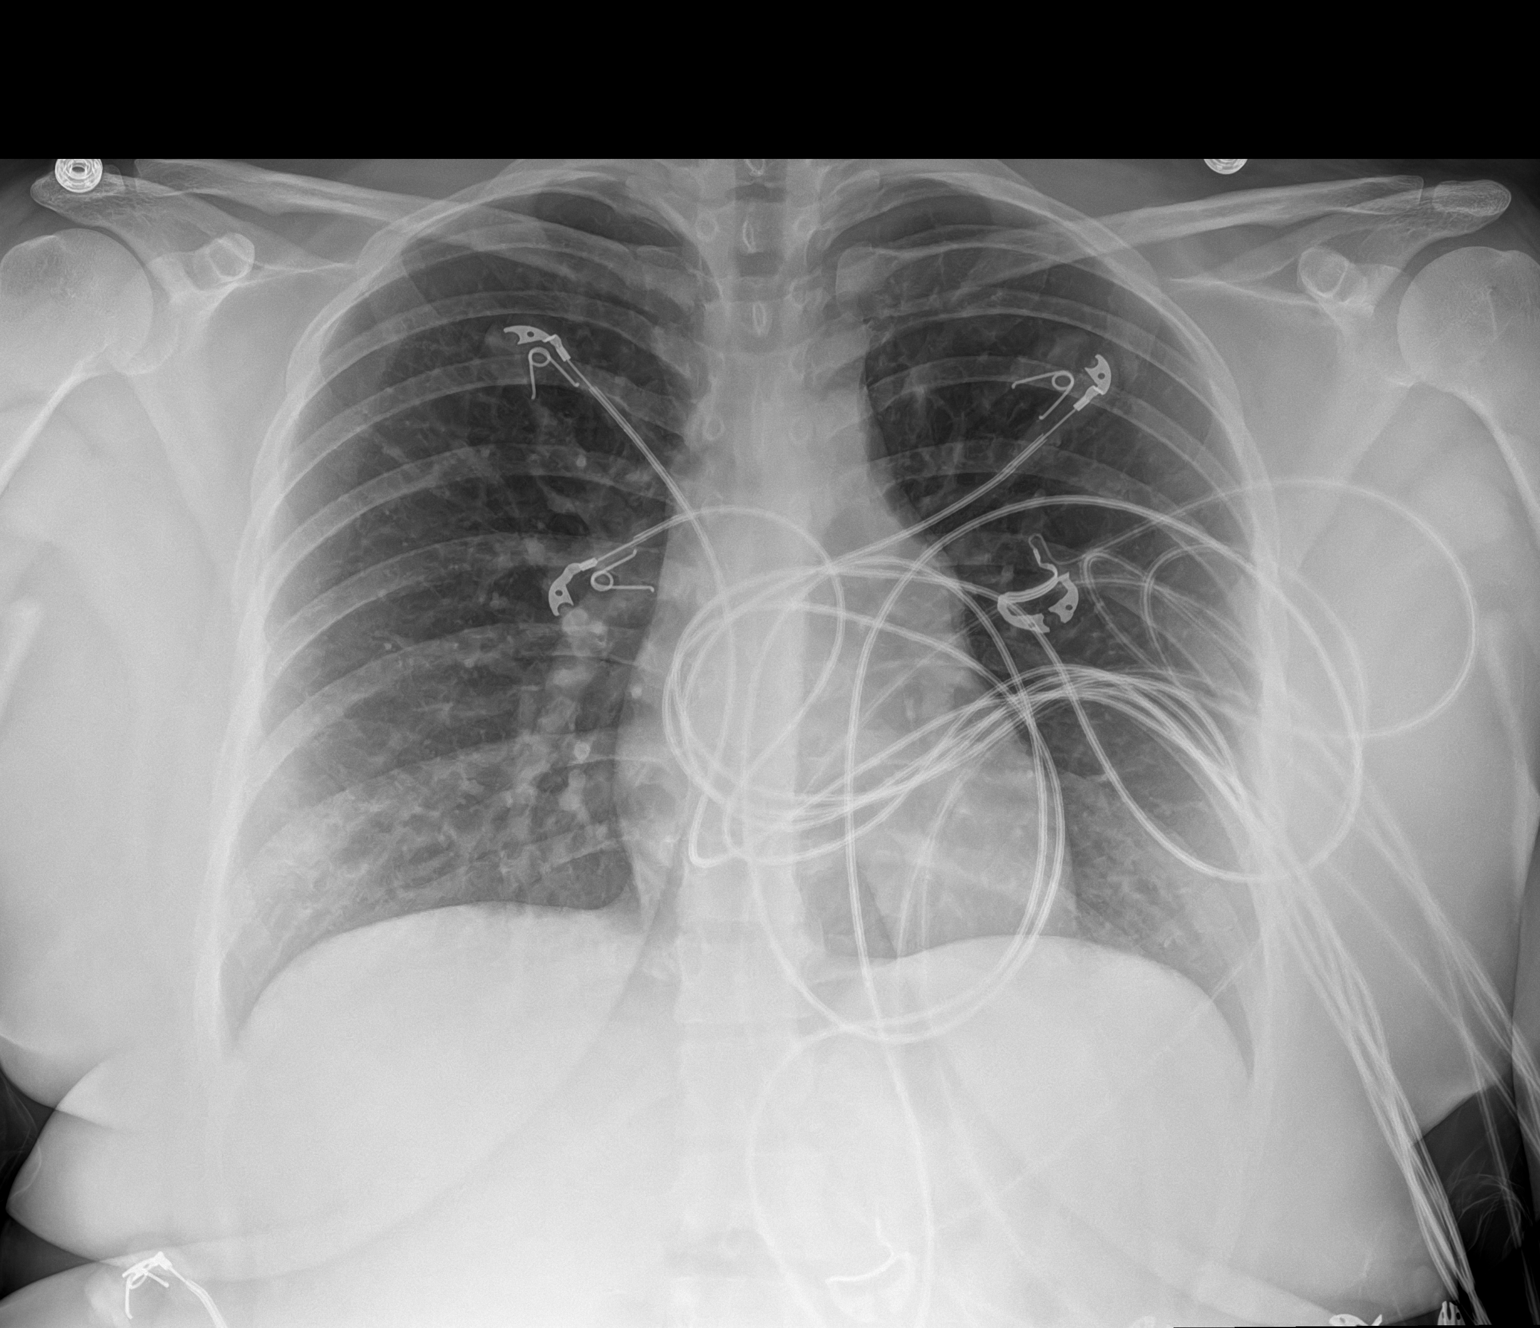

[1 of 1 positions shown; findings below may reference images not displayed]

FINDINGS: The heart size and mediastinal contours are within normal limits.
Mildly prominent bibasilar interstitial markings. No pleural
effusion or pneumothorax. The visualized skeletal structures are
unremarkable.
IMPRESSION: Mildly prominent bibasilar interstitial markings which may reflect
atelectasis versus developing infiltrates in the appropriate
clinical setting.

## 2021-07-01 IMAGING — US US PELVIS COMPLETE WITH TRANSVAGINAL
1 series · 14 of 25 positions shown · non-contrast
Comparison: None

CLINICAL DATA: LEFT lower quadrant pain and painful menses, LMP
10/14/2019



[Series 1: us pelvic complete with transvaginal · 14 of 65 slices shown]
[im 1/65]
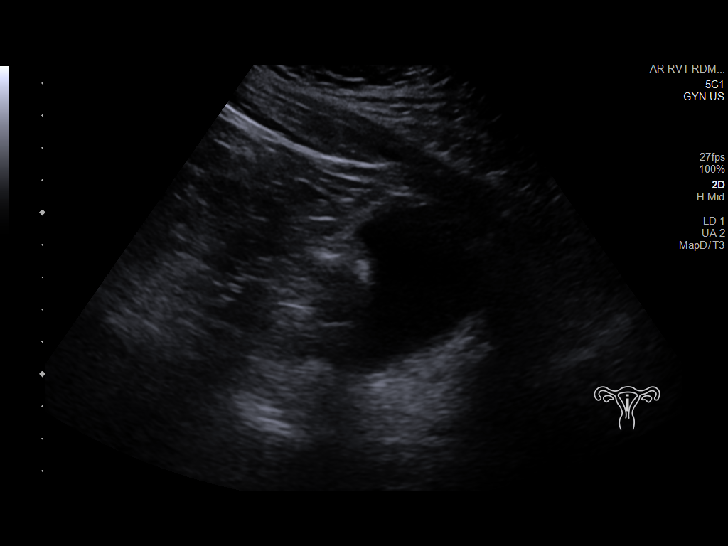
[im 6/65]
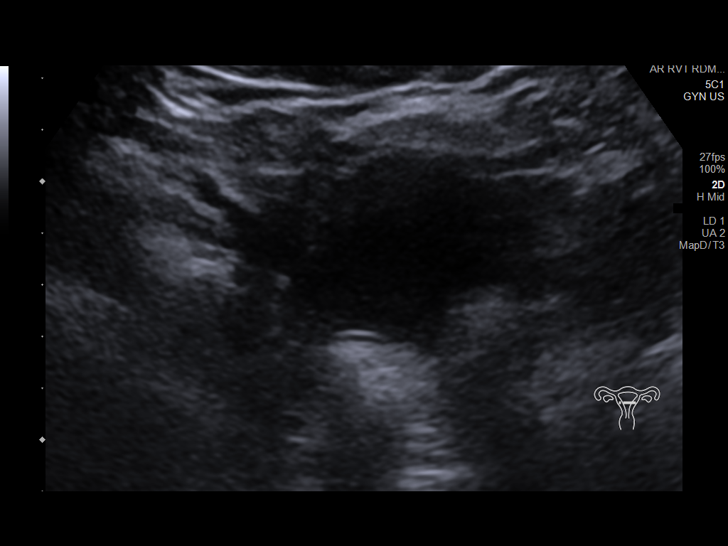
[im 11/65]
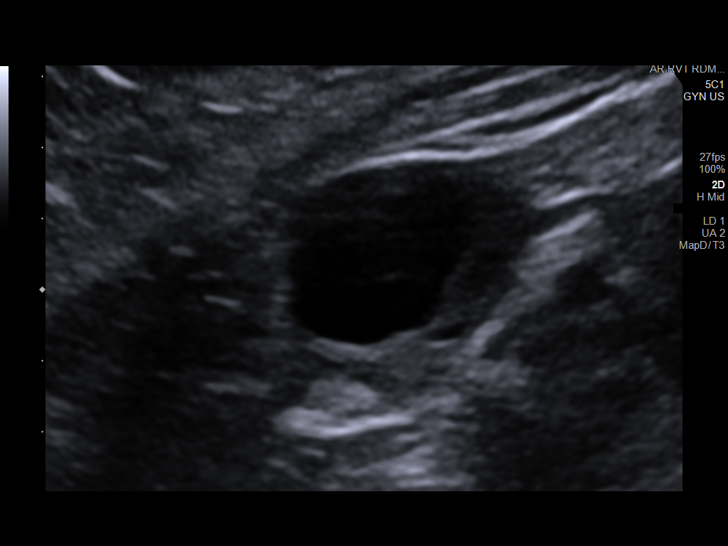
[im 17/65]
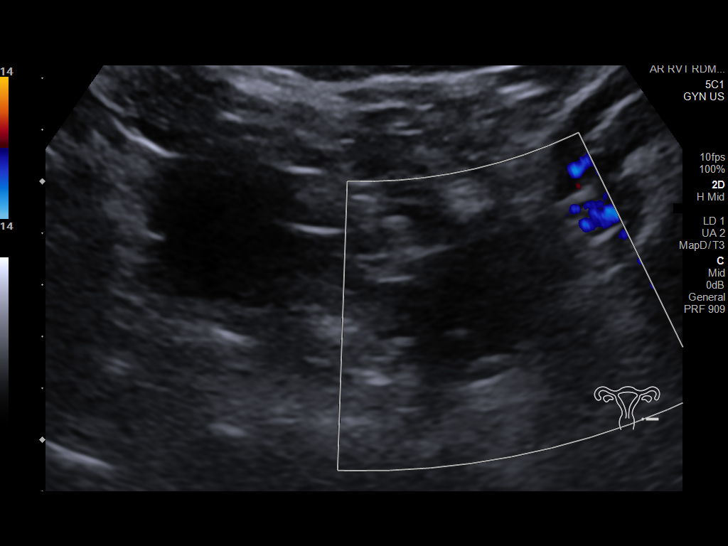
[im 22/65]
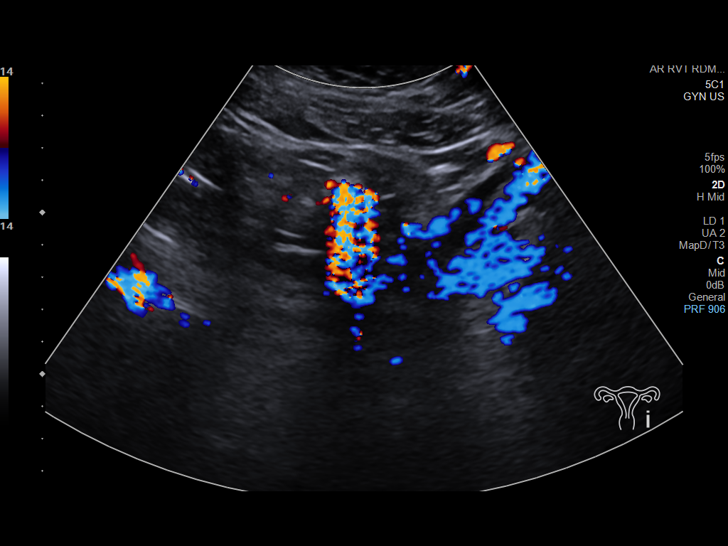
[im 25/65]
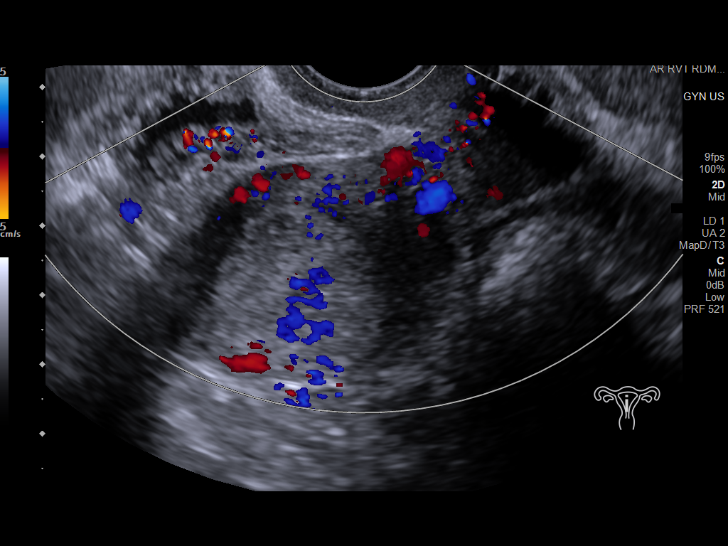
[im 30/65]
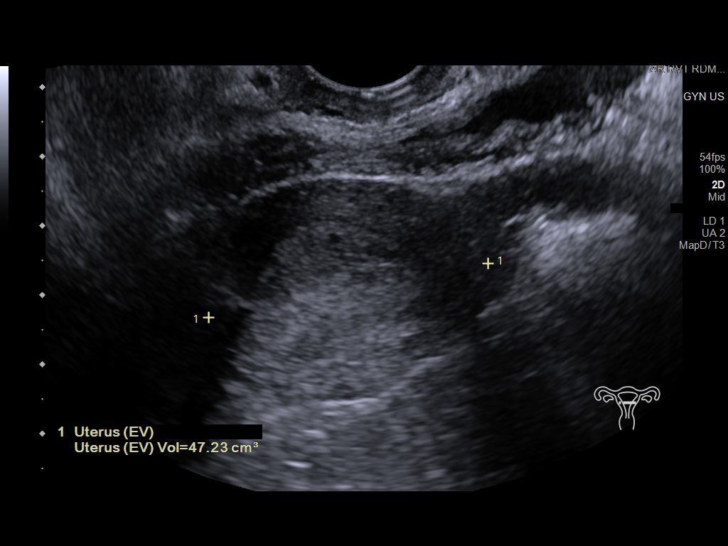
[im 35/65]
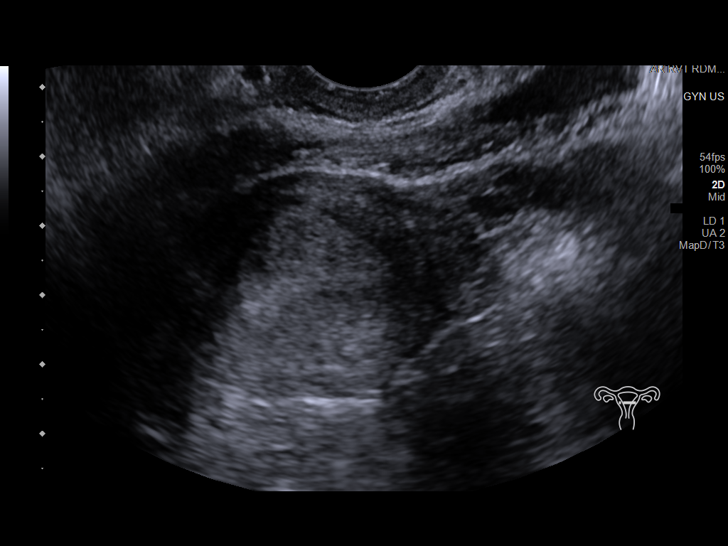
[im 41/65]
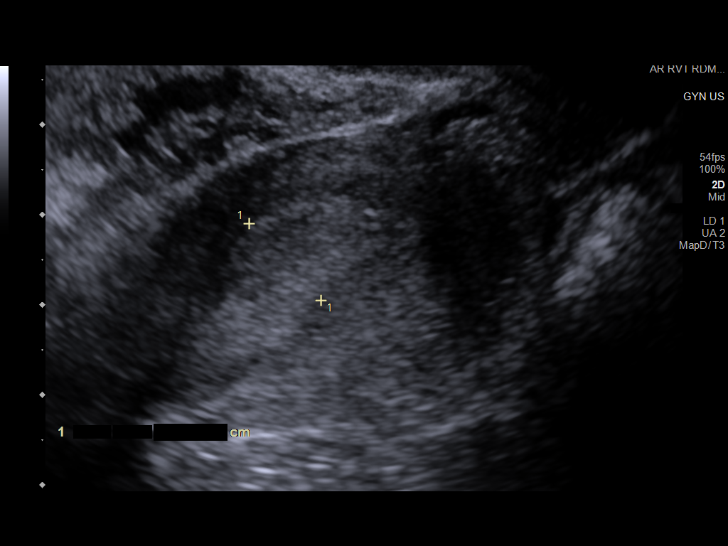
[im 43/65]
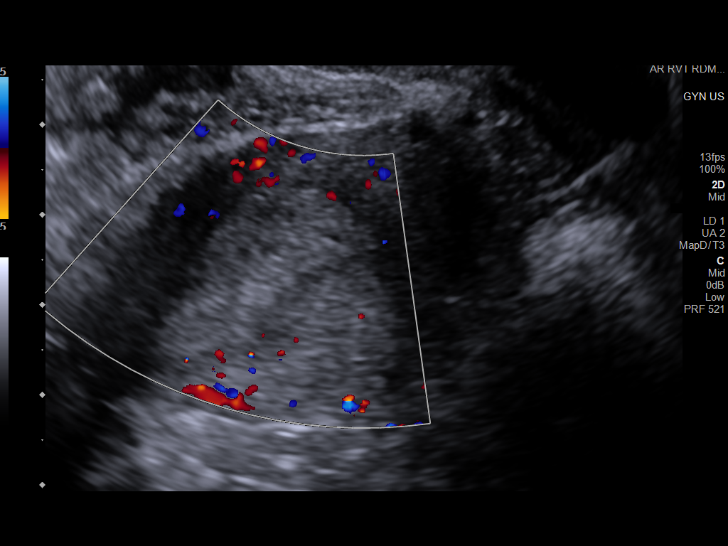
[im 49/65]
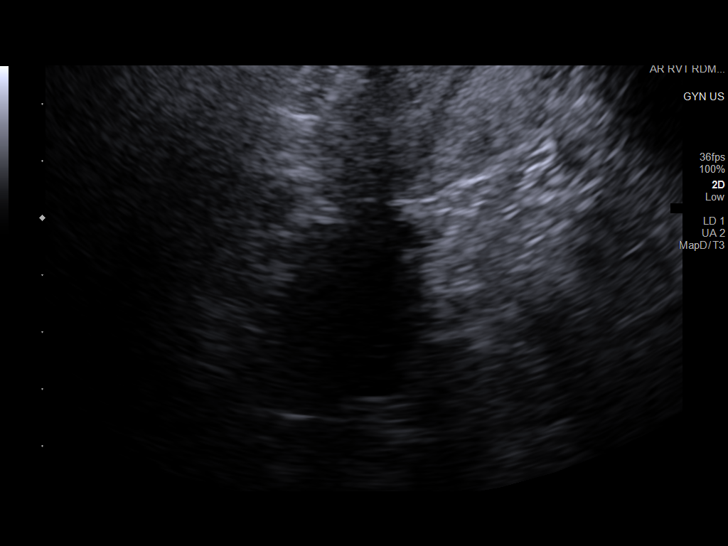
[im 54/65]
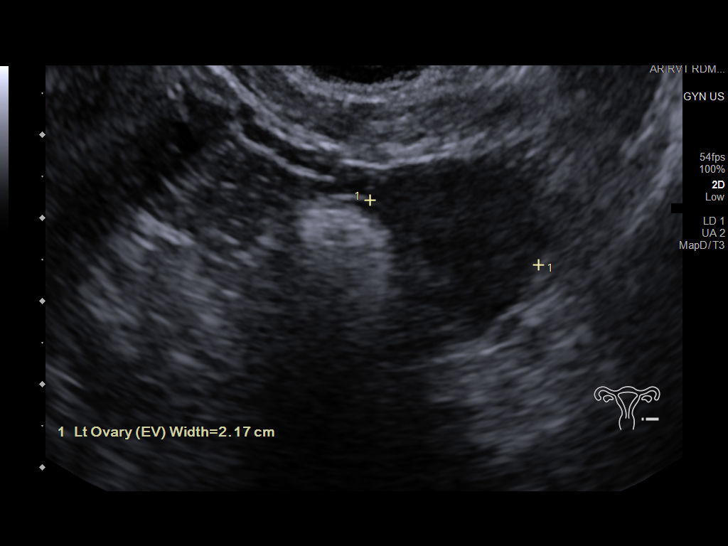
[im 59/65]
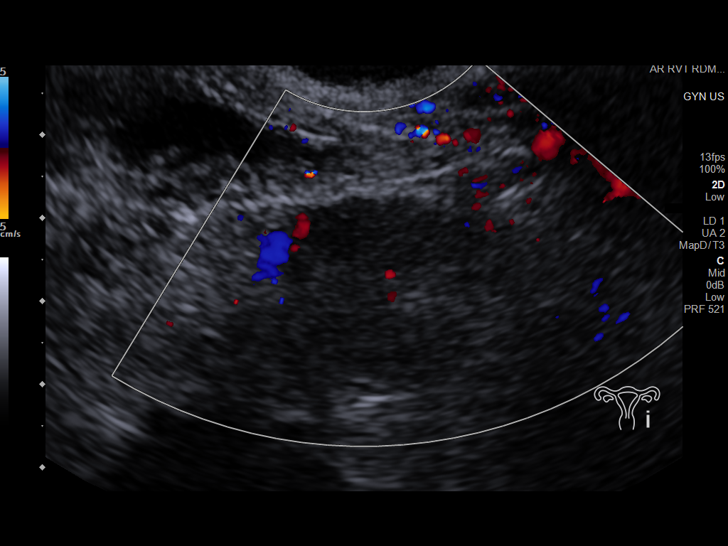
[im 65/65]
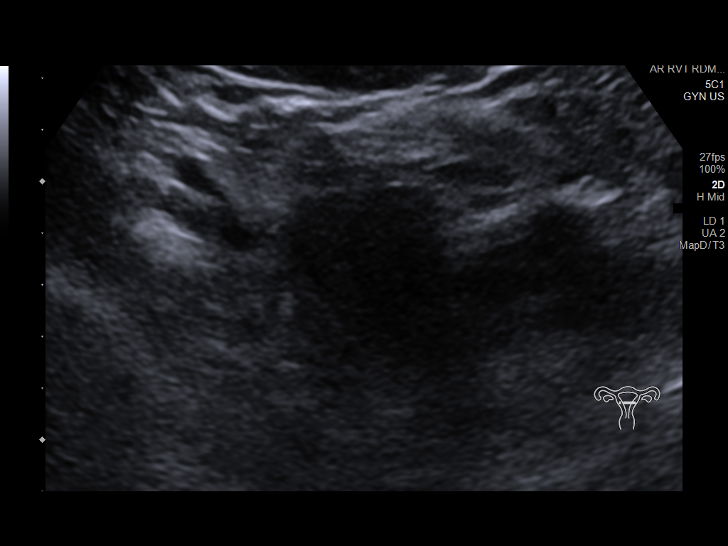

[14 of 25 positions shown; findings below may reference images not displayed]

FINDINGS: Uterus

Measurements: 6.6 x 3.3 x 4.1 cm = volume: 47 mL. Anteverted. Normal
morphology without mass

Endometrium

Thickness: 12 mm.  No endometrial fluid or focal abnormality

Right ovary

Measurements: 3.1 x 3.9 x 4.4 cm = volume: 27.7 mL. Dominant
physiologic follicle without additional masses.

Left ovary

Measurements: 3.4 x 2.2 x 2.2 cm = volume: 8.2 mL. Normal morphology
without mass

Other findings

No free pelvic fluid or adnexal masses.
IMPRESSION: Normal exam.

## 2022-04-06 ENCOUNTER — Encounter: Payer: Self-pay | Admitting: Radiology
